# Patient Record
Sex: Male | Born: 1950 | Race: White | Hispanic: No | Marital: Married | Smoking: Never smoker
Health system: Southern US, Community
[De-identification: ages and names within clinical notes are randomized; demographics above are authoritative.]

## PROBLEM LIST (undated history)

## (undated) DIAGNOSIS — Z9889 Other specified postprocedural states: Secondary | ICD-10-CM

## (undated) DIAGNOSIS — R112 Nausea with vomiting, unspecified: Secondary | ICD-10-CM

## (undated) DIAGNOSIS — M1990 Unspecified osteoarthritis, unspecified site: Secondary | ICD-10-CM

## (undated) DIAGNOSIS — C4490 Unspecified malignant neoplasm of skin, unspecified: Secondary | ICD-10-CM

## (undated) HISTORY — PX: SHOULDER SURGERY: SHX246

## (undated) HISTORY — PX: ANKLE GANGLION CYST EXCISION: SHX1148

---
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
## 2014-12-05 ENCOUNTER — Ambulatory Visit: Payer: Self-pay | Admitting: Orthopedic Surgery

## 2014-12-05 NOTE — Progress Notes (Signed)
Preoperative surgical orders have been place into the Epic hospital system for Jonathon Sanders on 12/05/2014, 5:55 PM  by Mickel Crow for surgery on 12-27-2014.  Preop Total Hip - Anterior Approach orders including Experel Injecion, IV Tylenol, and IV Decadron as long as there are no contraindications to the above medications. Arlee Muslim, PA-C

## 2014-12-05 NOTE — H&P (Signed)
Jaben Benegas DOB: 05/13/1951 Married / Language: English / Race: White Male Date of Admission: 12/27/2014 CC: Left Hip Pain History of Present Illness The patient is a 64 year old male who comes in for a preoperative History and Physical. The patient is scheduled for a left total hip arthroplasty (anterior) to be performed by Dr. [name]. [name], MD at [facility] on 12-27-2014. The patient is a 64 year old male who presented with a hip problem. The patient was seen for a second opinion.The patient reports left hip problems including pain symptoms that have been present for 4 month(s) (or more). The symptoms began in association with an established activity. The injury occured this August while the patient was while playing tennis. Prior to being seen today the patient was previously evaluated by a colleague (Dr. [name]) 3 month(s) ago. Symptoms present at the patient's previous evaluation included pain in the hip. Previous workup for this problem has included pelvic x-rays, hip x-rays (which he brought on a disc), spinal MRI and hip MRI (is available on the [facility]). Previous treatment for this problem has included oral corticosteroids, corticosteroid injection (Intra articular done in November) and nonsteroidal anti-inflammatory drugs. All of his pain he developed acutely back around August or September of this past year. 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 Pain is in the groin radiating down the anterior thigh. He saw Dr. [name] initially after feeing this and thought it was a hip strain. He was placed on a Dosepak but it did not provide a tremendous amount of benefit. He had been resting the hip without tennis for a while and it got a little better. It is starting to increase in pain again. He is starting to have more limitations in movement. He had x-rays back in August which just shows some degenerative change. He feels like  the overall process is worsening at this time. He also had a MRI scan with degenerative changes present. He did not have any stress fracture. He is at a stage now where he feels like he needs to do something with this hip because of the marked pain. He had an intraarticular injection which only helped for a few days. He is now ready to proceed with surgery at this time.  They have been treated conservatively in the past for the above stated problem and despite conservative measures, they continue to have progressive pain and severe functional limitations and dysfunction. They have failed non-operative management including home exercise, medications, and injections. It is felt that they would benefit from undergoing total joint replacement. Risks and benefits of the procedure have been discussed with the patient and they elect to proceed with surgery. There are no active contraindications to surgery such as ongoing infection or rapidly progressive neurological disease.  Problem List/Past Medical Primary osteoarthritis of left hip (M16.12) Anxiety Disorder Hypertension Erectile Disorder  Allergies No Known Drug Allergies  Family History Heart Disease Father, Maternal Grandmother, Paternal Grandfather, Paternal Grandmother. Osteoarthritis Mother. Cancer Father.  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 09/30/2014 Number of flights of stairs before winded greater than 5 Tobacco / smoke exposure 09/30/2014: no Exercise Exercises daily; does running / walking, gym / weights and team sport Current drinker 09/30/2014: Currently drinks beer and wine only occasionally per week Current work status  working full time [employer]  Medication History Aspirin Childrens (81MG  Tablet  Chewable, Oral) Active. CeleBREX (200MG  Capsule, Oral) Active. Viagra (100MG  Tablet, Oral) Active. Valtrex (1GM Tablet, Oral) Active. Kenalog in Orabase (0.1% Paste, Mouth/Throat) Active.  Past Surgical History Vasectomy Right Shoulder Surgery Ganglion Cyst Excision Left Thigh   Review of Systems General Not Present- Chills, Fatigue, Fever, Memory Loss, Night Sweats, Weight Gain and Weight Loss. Skin Not Present- Eczema, Hives, Itching, Lesions and Rash. HEENT Not Present- Dentures, Double Vision, Headache, Hearing Loss, Tinnitus and Visual Loss. Respiratory Not Present- Allergies, Chronic Cough, Coughing up blood, Shortness of breath at rest and Shortness of breath with exertion. Cardiovascular Not Present- Chest Pain, Difficulty Breathing Lying Down, Murmur, Palpitations, Racing/skipping heartbeats and Swelling. Gastrointestinal Not Present- Abdominal Pain, Bloody Stool, Constipation, Diarrhea, Difficulty Swallowing, Heartburn, Jaundice, Loss of appetitie, Nausea and Vomiting. Male Genitourinary Not Present- Blood in Urine, Discharge, Flank Pain, Incontinence, Painful Urination, Urgency, Urinary frequency, Urinary Retention, Urinating at Night and Weak urinary stream. Musculoskeletal Present- Joint Pain. Not Present- Back Pain, Joint Swelling, Morning Stiffness, Muscle Pain, Muscle Weakness and Spasms. Neurological Not Present- Blackout spells, Difficulty with balance, Dizziness, Paralysis, Tremor and Weakness. Psychiatric Not Present- Insomnia.   Vitals  Weight: 168 lb Height: 71.5in Body Surface Area: 1.97 m Body Mass Index: 23.1 kg/m  BP: 134/84 (Sitting, Left Arm, Standard)  Physical Exam General Mental Status -Alert, cooperative and good historian. General Appearance-pleasant, Not in acute distress. Orientation-Oriented X3. [place], Well nourished and Well developed.  Head and Neck Head-normocephalic, atraumatic . Neck Global  Assessment - supple, no bruit auscultated on the right, no bruit auscultated on the left.  Eye Pupil - Bilateral-Regular and Round. Motion - Bilateral-EOMI.  Chest and Lung Exam Auscultation Breath sounds - clear at anterior chest wall and clear at posterior chest wall. Adventitious sounds - No Adventitious sounds.  Cardiovascular Auscultation Rhythm - Regular rate and rhythm. Heart Sounds - S1 WNL and S2 WNL. Murmurs & Other Heart Sounds - Auscultation of the heart reveals - No Murmurs.  Abdomen Palpation/Percussion Tenderness - Abdomen is non-tender to palpation. Rigidity (guarding) - Abdomen is soft. Auscultation Auscultation of the abdomen reveals - Bowel sounds normal.  Male Genitourinary Note: Not done, not pertinent to present illness   Musculoskeletal Note: On exam, well developed male alert and oriented in no apparent distress. Evaluation of his right hip shows normal range of motion with no discomfort. His left hip has flexion to about 95, minimal internal rotation and about 20 external rotation, and 20 abduction. He is tender all throughout the hip girdle.  RADIOGRAPHS: Radiographs from August and a MRI subsequent to that. The plain radiographs did show some joint space narrowing and some impingement morphology.  MRI from October did show subchondral cystic changes in the acetabulum as well as significant narrowing.  Repeated plain film showed rapidly progressive arthritis and indeed he has essentially lost his joint space. There is minimal if any joint space left centrally and then superiorly he is bone on bone with osteophyte formation.   Assessment & Plan  Primary osteoarthritis of left hip (M16.12) Note:Surgical Plans: Left Total Hip Repalcement - Anterior Approach  Disposition: Home  PCP: Dr. Maryjean Ka - Patient has been seen preoperatively and felt to be stable for surgery. No further cardaic workup required prior to hip replacement. EKG  performed at PCP showed sinus bradycardia and an incomplete right bundle branch block.  IV TXA  Anesthesia Issues: Postop nausea  one time back in 1976  Signed electronically by Joelene Millin, III PA-C

## 2014-12-05 NOTE — H&P (Signed)
Jonathon Sanders DOB: 1951-07-13 Married / Language: English / Race: White Male Date of Admission:  12/27/2014 CC:  Left Hip Pain History of Present Illness The patient is a 64 year old male who comes in for a preoperative History and Physical. The patient is scheduled for a left total hip arthroplasty (anterior) to be performed by Dr. Dione Sanders. [name], MD at [facility] on 12-27-2014. The patient is a 64 year old male who presented with a hip problem. The patient was seen for a second opinion.The patient reports left hip problems including pain symptoms that have been present for 4 month(s) (or more). The symptoms began in association with an established activity. The injury occured this August while the patient was while playing tennis. Prior to being seen today the patient was previously evaluated by a colleague (Dr. Salvadore Sanders) 3 month(s) ago. Symptoms present at the patient's previous evaluation included pain in the hip. Previous workup for this problem has included pelvic x-rays, hip x-rays (which he brought on a disc), spinal MRI and hip MRI (is available on the [facility]). Previous treatment for this problem has included oral corticosteroids, corticosteroid injection (Intra articular done in November) and nonsteroidal anti-inflammatory drugs. All of his pain he developed acutely back around August or September of this past year. 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 Pain is in the groin radiating down the anterior thigh. He saw Dr. Onnie Sanders initially after feeing this and thought it was a hip strain. He was placed on a Dosepak but it did not provide a tremendous amount of benefit. He had been resting the hip without tennis for a while and it got a little better. It is starting to increase in pain again. He is starting to have more limitations in movement. He had x-rays back in August which just shows some degenerative change. He feels like  the overall process is worsening at this time. He also had a MRI scan with degenerative changes present. He did not have any stress fracture. He is at a stage now where he feels like he needs to do something with this hip because of the marked pain. He had an intraarticular injection which only helped for a few days. He is now ready to proceed with surgery at this time.  They have been treated conservatively in the past for the above stated problem and despite conservative measures, they continue to have progressive pain and severe functional limitations and dysfunction. They have failed non-operative management including home exercise, medications, and injections. It is felt that they would benefit from undergoing total joint replacement. Risks and benefits of the procedure have been discussed with the patient and they elect to proceed with surgery. There are no active contraindications to surgery such as ongoing infection or rapidly progressive neurological disease.  Problem List/Past Medical Primary osteoarthritis of left hip (M16.12) Anxiety Disorder Hypertension Erectile Disorder  Allergies No Known Drug Allergies  Family History Heart Disease Father, Maternal Grandmother, Paternal Grandfather, Paternal Grandmother. Osteoarthritis Mother. Cancer Father.  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 09/30/2014 Number of flights of stairs before winded greater than 5 Tobacco / smoke exposure 09/30/2014: no Exercise Exercises daily; does running / walking, gym / weights and team sport Current drinker 09/30/2014: Currently drinks beer and wine only occasionally per week Current  work status working full time [employer]  Medication History Aspirin Childrens (81MG  Tablet  Chewable, Oral) Active. CeleBREX (200MG  Capsule, Oral) Active. Viagra (100MG  Tablet, Oral) Active. Valtrex (1GM Tablet, Oral) Active. Kenalog in Orabase (0.1% Paste, Mouth/Throat) Active.  Past Surgical History Vasectomy Right Shoulder Surgery Ganglion Cyst Excision Left Thigh   Review of Systems General Not Present- Chills, Fatigue, Fever, Memory Loss, Night Sweats, Weight Gain and Weight Loss. Skin Not Present- Eczema, Hives, Itching, Lesions and Rash. HEENT Not Present- Dentures, Double Vision, Headache, Hearing Loss, Tinnitus and Visual Loss. Respiratory Not Present- Allergies, Chronic Cough, Coughing up blood, Shortness of breath at rest and Shortness of breath with exertion. Cardiovascular Not Present- Chest Pain, Difficulty Breathing Lying Down, Murmur, Palpitations, Racing/skipping heartbeats and Swelling. Gastrointestinal Not Present- Abdominal Pain, Bloody Stool, Constipation, Diarrhea, Difficulty Swallowing, Heartburn, Jaundice, Loss of appetitie, Nausea and Vomiting. Male Genitourinary Not Present- Blood in Urine, Discharge, Flank Pain, Incontinence, Painful Urination, Urgency, Urinary frequency, Urinary Retention, Urinating at Night and Weak urinary stream. Musculoskeletal Present- Joint Pain. Not Present- Back Pain, Joint Swelling, Morning Stiffness, Muscle Pain, Muscle Weakness and Spasms. Neurological Not Present- Blackout spells, Difficulty with balance, Dizziness, Paralysis, Tremor and Weakness. Psychiatric Not Present- Insomnia.   Vitals  Weight: 168 lb Height: 71.5in Body Surface Area: 1.97 m Body Mass Index: 23.1 kg/m  BP: 134/84 (Sitting, Left Arm, Standard)  Physical Exam General Mental Status -Alert, cooperative and good historian. General Appearance-pleasant, Not in acute distress. Orientation-Oriented X3. [name], Well nourished and Well developed.  Head and Neck Head-normocephalic, atraumatic . Neck Global  Assessment - supple, no bruit auscultated on the right, no bruit auscultated on the left.  Eye Pupil - Bilateral-Regular and Round. Motion - Bilateral-EOMI.  Chest and Lung Exam Auscultation Breath sounds - clear at anterior chest wall and clear at posterior chest wall. Adventitious sounds - No Adventitious sounds.  Cardiovascular Auscultation Rhythm - Regular rate and rhythm. Heart Sounds - S1 WNL and S2 WNL. Murmurs & Other Heart Sounds - Auscultation of the heart reveals - No Murmurs.  Abdomen Palpation/Percussion Tenderness - Abdomen is non-tender to palpation. Rigidity (guarding) - Abdomen is soft. Auscultation Auscultation of the abdomen reveals - Bowel sounds normal.  Male Genitourinary Note: Not done, not pertinent to present illness   Musculoskeletal Note: On exam, well developed male alert and oriented in no apparent distress. Evaluation of his right hip shows normal range of motion with no discomfort. His left hip has flexion to about 95, minimal internal rotation and about 20 external rotation, and 20 abduction. He is tender all throughout the hip girdle.  RADIOGRAPHS: Radiographs from August and a MRI subsequent to that. The plain radiographs did show some joint space narrowing and some impingement morphology.  MRI from October did show subchondral cystic changes in the acetabulum as well as significant narrowing.  Repeated plain film showed rapidly progressive arthritis and indeed he has essentially lost his joint space. There is minimal if any joint space left centrally and then superiorly he is bone on bone with osteophyte formation.   Assessment & Plan  Primary osteoarthritis of left hip (M16.12) Note:Surgical Plans: Left Total Hip Repalcement - Anterior Approach  Disposition: Home  PCP: Dr. Maryjean Ka - Patient has been seen preoperatively and felt to be stable for surgery. No further cardaic workup required prior to hip replacement. EKG  performed at PCP showed sinus bradycardia and an incomplete right bundle branch block.  IV TXA  Anesthesia Issues:  Postop nausea one time back in 1976  Signed electronically by Treyven Lafauci Monika Salk, III PA-C

## 2014-12-10 ENCOUNTER — Ambulatory Visit: Payer: Self-pay | Admitting: Orthopedic Surgery

## 2014-12-10 NOTE — H&P (Signed)
Jonathon Sanders DOB: 03/11/1951 Married / Language: English / Race: White Male Date of Admission: 12/27/2014 CC: Left Hip Pain History of Present Illness The patient is a 64 year old male who comes in for a preoperative History and Physical. The patient is scheduled for a left total hip arthroplasty (anterior) to be performed by Dr. [name]. [name], MD at [facility] on 12-27-2014. The patient is a 64 year old male who presented with a hip problem. The patient was seen for a second opinion.The patient reports left hip problems including pain symptoms that have been present for 4 month(s) (or more). The symptoms began in association with an established activity. The injury occured this August while the patient was while playing tennis. Prior to being seen today the patient was previously evaluated by a colleague (Dr. [name]) 3 month(s) ago. Symptoms present at the patient's previous evaluation included pain in the hip. Previous workup for this problem has included pelvic x-rays, hip x-rays (which he brought on a disc), spinal MRI and hip MRI (is available on the [facility]). Previous treatment for this problem has included oral corticosteroids, corticosteroid injection (Intra articular done in November) and nonsteroidal anti-inflammatory drugs. All of his pain he developed acutely back around August or September of this past year. 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 Pain is in the groin radiating down the anterior thigh. He saw Dr. [name] initially after feeing this and thought it was a hip strain. He was placed on a Dosepak but it did not provide a tremendous amount of benefit. He had been resting the hip without tennis for a while and it got a little better. It is starting to increase in pain again. He is starting to have more limitations in movement. He had x-rays back in August which just shows some degenerative change. He feels like  the overall process is worsening at this time. He also had a MRI scan with degenerative changes present. He did not have any stress fracture. He is at a stage now where he feels like he needs to do something with this hip because of the marked pain. He had an intraarticular injection which only helped for a few days. He is now ready to proceed with surgery at this time.  They have been treated conservatively in the past for the above stated problem and despite conservative measures, they continue to have progressive pain and severe functional limitations and dysfunction. They have failed non-operative management including home exercise, medications, and injections. It is felt that they would benefit from undergoing total joint replacement. Risks and benefits of the procedure have been discussed with the patient and they elect to proceed with surgery. There are no active contraindications to surgery such as ongoing infection or rapidly progressive neurological disease.  Problem List/Past Medical Primary osteoarthritis of left hip (M16.12) Anxiety Disorder Hypertension Erectile Disorder  Allergies No Known Drug Allergies  Family History Heart Disease Father, Maternal Grandmother, Paternal Grandfather, Paternal Grandmother. Osteoarthritis Mother. Cancer Father.  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 09/30/2014 Number of flights of stairs before winded greater than 5 Tobacco / smoke exposure 09/30/2014: no Exercise Exercises daily; does running / walking, gym / weights and team sport Current drinker 09/30/2014: Currently drinks beer and wine only occasionally per week Current work status  working full time [employer]  Medication History Aspirin Childrens (81MG  Tablet  Chewable, Oral) Active. CeleBREX (200MG  Capsule, Oral) Active. Viagra (100MG  Tablet, Oral) Active. Valtrex (1GM Tablet, Oral) Active. Kenalog in Orabase (0.1% Paste, Mouth/Throat) Active.  Past Surgical History Vasectomy Right Shoulder Surgery Ganglion Cyst Excision Left Thigh   Review of Systems General Not Present- Chills, Fatigue, Fever, Memory Loss, Night Sweats, Weight Gain and Weight Loss. Skin Not Present- Eczema, Hives, Itching, Lesions and Rash. HEENT Not Present- Dentures, Double Vision, Headache, Hearing Loss, Tinnitus and Visual Loss. Respiratory Not Present- Allergies, Chronic Cough, Coughing up blood, Shortness of breath at rest and Shortness of breath with exertion. Cardiovascular Not Present- Chest Pain, Difficulty Breathing Lying Down, Murmur, Palpitations, Racing/skipping heartbeats and Swelling. Gastrointestinal Not Present- Abdominal Pain, Bloody Stool, Constipation, Diarrhea, Difficulty Swallowing, Heartburn, Jaundice, Loss of appetitie, Nausea and Vomiting. Male Genitourinary Not Present- Blood in Urine, Discharge, Flank Pain, Incontinence, Painful Urination, Urgency, Urinary frequency, Urinary Retention, Urinating at Night and Weak urinary stream. Musculoskeletal Present- Joint Pain. Not Present- Back Pain, Joint Swelling, Morning Stiffness, Muscle Pain, Muscle Weakness and Spasms. Neurological Not Present- Blackout spells, Difficulty with balance, Dizziness, Paralysis, Tremor and Weakness. Psychiatric Not Present- Insomnia.   Vitals  Weight: 168 lb Height: 71.5in Body Surface Area: 1.97 m Body Mass Index: 23.1 kg/m  BP: 134/84 (Sitting, Left Arm, Standard)  Physical Exam General Mental Status -Alert, cooperative and good historian. General Appearance-pleasant, Not in acute distress. Orientation-Oriented X3. [name], Well nourished and Well developed.  Head and Neck Head-normocephalic, atraumatic . Neck Global  Assessment - supple, no bruit auscultated on the right, no bruit auscultated on the left.  Eye Pupil - Bilateral-Regular and Round. Motion - Bilateral-EOMI.  Chest and Lung Exam Auscultation Breath sounds - clear at anterior chest wall and clear at posterior chest wall. Adventitious sounds - No Adventitious sounds.  Cardiovascular Auscultation Rhythm - Regular rate and rhythm. Heart Sounds - S1 WNL and S2 WNL. Murmurs & Other Heart Sounds - Auscultation of the heart reveals - No Murmurs.  Abdomen Palpation/Percussion Tenderness - Abdomen is non-tender to palpation. Rigidity (guarding) - Abdomen is soft. Auscultation Auscultation of the abdomen reveals - Bowel sounds normal.  Male Genitourinary Note: Not done, not pertinent to present illness   Musculoskeletal Note: On exam, well developed male alert and oriented in no apparent distress. Evaluation of his right hip shows normal range of motion with no discomfort. His left hip has flexion to about 95, minimal internal rotation and about 20 external rotation, and 20 abduction. He is tender all throughout the hip girdle.  RADIOGRAPHS: Radiographs from August and a MRI subsequent to that. The plain radiographs did show some joint space narrowing and some impingement morphology.  MRI from October did show subchondral cystic changes in the acetabulum as well as significant narrowing.  Repeated plain film showed rapidly progressive arthritis and indeed he has essentially lost his joint space. There is minimal if any joint space left centrally and then superiorly he is bone on bone with osteophyte formation.   Assessment & Plan  Primary osteoarthritis of left hip (M16.12) Note:Surgical Plans: Left Total Hip Repalcement - Anterior Approach  Disposition: Home  PCP: Dr. Maryjean Ka - Patient has been seen preoperatively and felt to be stable for surgery. No further cardaic workup required prior to hip replacement. EKG  performed at PCP showed sinus bradycardia and an incomplete right bundle branch block.  IV TXA  Anesthesia Issues: Postop nausea  one time back in 1976  Signed electronically by Joelene Millin, III PA-C

## 2014-12-13 NOTE — Pre-Procedure Instructions (Signed)
NEILSON OEHLERT  12/13/2014   Your procedure is scheduled on: Friday, December 27, 2014  Report to [facility] Admitting at 12:30 PM.  Call this number if you have problems the morning of surgery: [phone]   Remember:   Do not eat food or drink liquids after midnight Thursday, December 26, 2014   Take these medicines the morning of surgery with A SIP OF WATER: None Stop taking Aspirin, vitamins, and herbal medications such as  Omega-3 Fatty Acids (FISH OIL ).   Do not take any NSAIDs ie: Ibuprofen, Advil, Naproxen or any medication containing Aspirin such as celecoxib (CELEBREX); stop 1 week prior to procedure ( Friday, December 20, 2014).  Do not wear jewelry, make-up or nail polish.  Do not wear lotions, powders, or perfumes. You may not wear deodorant.  Do not shave 48 hours prior to surgery.   Do not bring valuables to the hospital.  [facility] is not responsible for any belongings or valuables.               Contacts, dentures or bridgework may not be worn into surgery.  Leave suitcase in the car. After surgery it may be brought to your room.  For patients admitted to the hospital, discharge time is determined by your treatment team.               Patients discharged the day of surgery will not be allowed to drive home.  Name and phone number of your driver:   Special Instructions:  Special Instructions:Special Instructions: [facility] - Preparing for Surgery  Before surgery, you can play an important role.  Because skin is not sterile, your skin needs to be as free of germs as possible.  You can reduce the number of germs on you skin by washing with CHG (chlorahexidine gluconate) soap before surgery.  CHG is an antiseptic cleaner which kills germs and bonds with the skin to continue killing germs even after washing.  Please DO NOT use if you have an allergy to CHG or antibacterial soaps.  If your skin becomes reddened/irritated stop using the CHG and inform your  nurse when you arrive at Short Stay.  Do not shave (including legs and underarms) for at least 48 hours prior to the first CHG shower.  You may shave your face.  Please follow these instructions carefully:   1.  Shower with CHG Soap the night before surgery and the morning of Surgery.  2.  If you choose to wash your hair, wash your hair first as usual with your normal shampoo.  3.  After you shampoo, rinse your hair and body thoroughly to remove the Shampoo.  4.  Use CHG as you would any other liquid soap.  You can apply chg directly  to the skin and wash gently with scrungie or a clean washcloth.  5.  Apply the CHG Soap to your body ONLY FROM THE NECK DOWN.  Do not use on open wounds or open sores.  Avoid contact with your eyes, ears, mouth and genitals (private parts).  Wash genitals (private parts) with your normal soap.  6.  Wash thoroughly, paying special attention to the area where your surgery will be performed.  7.  Thoroughly rinse your body with warm water from the neck down.  8.  DO NOT shower/wash with your normal soap after using and rinsing off the CHG Soap.  9.  Pat yourself dry with a clean towel.  10.  Wear clean pajamas.            11.  Place clean sheets on your bed the night of your first shower and do not sleep with pets.  Day of Surgery  Do not apply any lotions/deoderants the morning of surgery.  Please wear clean clothes to the hospital/surgery center.   Please read over the following fact sheets that you were given: Pain Booklet, Coughing and Deep Breathing, Blood Transfusion Information, Total Joint Packet, MRSA Information and Surgical Site Infection Prevention

## 2014-12-16 ENCOUNTER — Encounter (HOSPITAL_COMMUNITY): Payer: Self-pay

## 2014-12-16 ENCOUNTER — Encounter (HOSPITAL_COMMUNITY)
Admission: RE | Admit: 2014-12-16 | Discharge: 2014-12-16 | Disposition: A | Discharge status: Home / Self Care | Payer: Commercial Managed Care - PPO | Source: Ambulatory Visit | Attending: Orthopedic Surgery | Admitting: Orthopedic Surgery

## 2014-12-16 DIAGNOSIS — M1612 Unilateral primary osteoarthritis, left hip: Secondary | ICD-10-CM | POA: Insufficient documentation

## 2014-12-16 DIAGNOSIS — Z01818 Encounter for other preprocedural examination: Secondary | ICD-10-CM | POA: Diagnosis present

## 2014-12-16 HISTORY — DX: Unspecified malignant neoplasm of skin, unspecified: C44.90

## 2014-12-16 HISTORY — DX: Other specified postprocedural states: R11.2

## 2014-12-16 HISTORY — DX: Unspecified osteoarthritis, unspecified site: M19.90

## 2014-12-16 HISTORY — DX: Nausea with vomiting, unspecified: Z98.890

## 2014-12-16 LAB — COMPREHENSIVE METABOLIC PANEL
ALBUMIN: 3.8 g/dL (ref 3.5–5.2)
ALK PHOS: 74 U/L (ref 39–117)
ALT: 34 U/L (ref 0–53)
ANION GAP: 3 — AB (ref 5–15)
AST: 32 U/L (ref 0–37)
BUN: 15 mg/dL (ref 6–23)
CHLORIDE: 105 mmol/L (ref 96–112)
CO2: 31 mmol/L (ref 19–32)
CREATININE: 0.81 mg/dL (ref 0.50–1.35)
Calcium: 9.2 mg/dL (ref 8.4–10.5)
GFR calc non Af Amer: >90 mL/min (ref >90)
Glucose, Bld: 93 mg/dL (ref 70–99)
Potassium: 4 mmol/L (ref 3.5–5.1)
Sodium: 139 mmol/L (ref 135–145)
Total Bilirubin: 0.8 mg/dL (ref 0.3–1.2)
Total Protein: 6.7 g/dL (ref 6.0–8.3)

## 2014-12-16 LAB — URINALYSIS, ROUTINE W REFLEX MICROSCOPIC
BILIRUBIN URINE: NEGATIVE
Glucose, UA: NEGATIVE mg/dL
Hgb urine dipstick: NEGATIVE
KETONES UR: NEGATIVE mg/dL
LEUKOCYTES UA: NEGATIVE
NITRITE: NEGATIVE
Protein, ur: NEGATIVE mg/dL
Specific Gravity, Urine: 1.021 (ref 1.005–1.030)
UROBILINOGEN UA: 1 mg/dL (ref 0.0–1.0)
pH: 6 (ref 5.0–8.0)

## 2014-12-16 LAB — TYPE AND SCREEN
ABO/RH(D): O POS
ANTIBODY SCREEN: NEGATIVE

## 2014-12-16 LAB — SURGICAL PCR SCREEN
MRSA, PCR: NEGATIVE
Staphylococcus aureus: POSITIVE — AB

## 2014-12-16 LAB — ABO/RH: ABO/RH(D): O POS

## 2014-12-16 LAB — CBC
HEMATOCRIT: 42.9 % (ref 39.0–52.0)
HEMOGLOBIN: 14.8 g/dL (ref 13.0–17.0)
MCH: 31.6 pg (ref 26.0–34.0)
MCHC: 34.5 g/dL (ref 30.0–36.0)
MCV: 91.7 fL (ref 78.0–100.0)
Platelets: 209 10*3/uL (ref 150–400)
RBC: 4.68 MIL/uL (ref 4.22–5.81)
RDW: 13.1 % (ref 11.5–15.5)
WBC: 5.5 10*3/uL (ref 4.0–10.5)

## 2014-12-16 LAB — PROTIME-INR
INR: 1 (ref 0.00–1.49)
Prothrombin Time: 13.3 seconds (ref 11.6–15.2)

## 2014-12-16 LAB — APTT: aPTT: 28 seconds (ref 24–37)

## 2014-12-16 NOTE — Progress Notes (Signed)
PCP is Dr Maryjean Ka Denies seeing a cardiologist. Denies ever having a stress test, echo, or card cath. Denies chest pain.

## 2014-12-16 NOTE — Progress Notes (Signed)
Message left for pt at [phone] to inform him of positive MSSA results from PCR screen. Message also left at the [facility] on [address] for script for [place].

## 2014-12-26 MED ORDER — CHLORHEXIDINE GLUCONATE 4 % EX LIQD: 60.0000 mL | Freq: Once | CUTANEOUS | Status: DC

## 2014-12-26 MED ORDER — TRANEXAMIC ACID 100 MG/ML IV SOLN
1000.0000 mg | INTRAVENOUS | Status: DC
  Filled 2014-12-26: qty 10

## 2014-12-26 MED ORDER — DEXAMETHASONE SODIUM PHOSPHATE 10 MG/ML IJ SOLN
10.0000 mg | Freq: Once | INTRAMUSCULAR | Status: AC
  Administered 2014-12-27: 10 mg via INTRAVENOUS
  Filled 2014-12-26: qty 1

## 2014-12-26 MED ORDER — CEFAZOLIN SODIUM-DEXTROSE 2-3 GM-% IV SOLR
2.0000 g | INTRAVENOUS | Status: AC
  Administered 2014-12-27: 2 g via INTRAVENOUS
  Filled 2014-12-26: qty 50

## 2014-12-26 MED ORDER — BUPIVACAINE LIPOSOME 1.3 % IJ SUSP
20.0000 mL | Freq: Once | INTRAMUSCULAR | Status: DC
  Filled 2014-12-26: qty 20

## 2014-12-26 MED ORDER — SODIUM CHLORIDE 0.9 % IV SOLN: INTRAVENOUS | Status: DC

## 2014-12-26 MED ORDER — ACETAMINOPHEN 10 MG/ML IV SOLN
1000.0000 mg | Freq: Once | INTRAVENOUS | Status: AC
  Administered 2014-12-27: 1000 mg via INTRAVENOUS
  Filled 2014-12-26: qty 100

## 2014-12-27 ENCOUNTER — Inpatient Hospital Stay (HOSPITAL_COMMUNITY): Payer: Commercial Managed Care - PPO | Admitting: Anesthesiology

## 2014-12-27 ENCOUNTER — Encounter (HOSPITAL_COMMUNITY): Payer: Self-pay

## 2014-12-27 ENCOUNTER — Inpatient Hospital Stay (HOSPITAL_COMMUNITY): Payer: Commercial Managed Care - PPO

## 2014-12-27 ENCOUNTER — Inpatient Hospital Stay (HOSPITAL_COMMUNITY)
Admission: RE | Admit: 2014-12-27 | Discharge: 2014-12-28 | DRG: 470 | Disposition: A | Discharge status: Home Health | Payer: Commercial Managed Care - PPO | Source: Ambulatory Visit | Attending: Orthopedic Surgery | Admitting: Orthopedic Surgery

## 2014-12-27 ENCOUNTER — Encounter (HOSPITAL_COMMUNITY)
Admission: RE | Disposition: A | Discharge status: Home Health | Payer: Self-pay | Source: Ambulatory Visit | Attending: Orthopedic Surgery

## 2014-12-27 DIAGNOSIS — Z7901 Long term (current) use of anticoagulants: Secondary | ICD-10-CM | POA: Diagnosis not present

## 2014-12-27 DIAGNOSIS — M1612 Unilateral primary osteoarthritis, left hip: Secondary | ICD-10-CM | POA: Diagnosis present

## 2014-12-27 DIAGNOSIS — Z8261 Family history of arthritis: Secondary | ICD-10-CM

## 2014-12-27 DIAGNOSIS — M169 Osteoarthritis of hip, unspecified: Secondary | ICD-10-CM | POA: Diagnosis present

## 2014-12-27 DIAGNOSIS — F419 Anxiety disorder, unspecified: Secondary | ICD-10-CM | POA: Diagnosis present

## 2014-12-27 DIAGNOSIS — Z79899 Other long term (current) drug therapy: Secondary | ICD-10-CM | POA: Diagnosis not present

## 2014-12-27 DIAGNOSIS — I10 Essential (primary) hypertension: Secondary | ICD-10-CM | POA: Diagnosis present

## 2014-12-27 DIAGNOSIS — M25552 Pain in left hip: Secondary | ICD-10-CM | POA: Diagnosis present

## 2014-12-27 DIAGNOSIS — N529 Male erectile dysfunction, unspecified: Secondary | ICD-10-CM | POA: Diagnosis present

## 2014-12-27 DIAGNOSIS — Z8249 Family history of ischemic heart disease and other diseases of the circulatory system: Secondary | ICD-10-CM | POA: Diagnosis not present

## 2014-12-27 DIAGNOSIS — Z419 Encounter for procedure for purposes other than remedying health state, unspecified: Secondary | ICD-10-CM

## 2014-12-27 DIAGNOSIS — Z96649 Presence of unspecified artificial hip joint: Secondary | ICD-10-CM

## 2014-12-27 HISTORY — PX: TOTAL HIP ARTHROPLASTY: SHX124

## 2014-12-27 SURGERY — ARTHROPLASTY, HIP, TOTAL, ANTERIOR APPROACH
Anesthesia: Choice | Site: Hip | Laterality: Left

## 2014-12-27 MED ORDER — RIVAROXABAN 10 MG PO TABS
10.0000 mg | ORAL_TABLET | Freq: Every day | ORAL | Status: DC
  Administered 2014-12-28: 10 mg via ORAL
  Filled 2014-12-27: qty 1

## 2014-12-27 MED ORDER — PROPOFOL 10 MG/ML IV BOLUS
INTRAVENOUS | Status: AC
  Filled 2014-12-27: qty 20

## 2014-12-27 MED ORDER — ACETAMINOPHEN 10 MG/ML IV SOLN
INTRAVENOUS | Status: AC
  Filled 2014-12-27: qty 100

## 2014-12-27 MED ORDER — BISACODYL 10 MG RE SUPP: 10.0000 mg | Freq: Every day | RECTAL | Status: DC | PRN

## 2014-12-27 MED ORDER — 0.9 % SODIUM CHLORIDE (POUR BTL) OPTIME
TOPICAL | Status: DC | PRN
  Administered 2014-12-27: 1000 mL

## 2014-12-27 MED ORDER — BUPIVACAINE LIPOSOME 1.3 % IJ SUSP
INTRAMUSCULAR | Status: DC | PRN
  Administered 2014-12-27: 20 mL

## 2014-12-27 MED ORDER — DEXTROSE 50 % IV SOLN
INTRAVENOUS | Status: AC
  Filled 2014-12-27: qty 50

## 2014-12-27 MED ORDER — KETOROLAC TROMETHAMINE 15 MG/ML IJ SOLN
7.5000 mg | Freq: Four times a day (QID) | INTRAMUSCULAR | Status: AC | PRN
  Administered 2014-12-27: 7.5 mg via INTRAVENOUS
  Filled 2014-12-27 (×2): qty 1

## 2014-12-27 MED ORDER — CEFAZOLIN SODIUM-DEXTROSE 2-3 GM-% IV SOLR
2.0000 g | Freq: Four times a day (QID) | INTRAVENOUS | Status: AC
  Administered 2014-12-28: 2 g via INTRAVENOUS
  Filled 2014-12-27 (×3): qty 50

## 2014-12-27 MED ORDER — METOCLOPRAMIDE HCL 5 MG/ML IJ SOLN: 5.0000 mg | Freq: Three times a day (TID) | INTRAMUSCULAR | Status: DC | PRN

## 2014-12-27 MED ORDER — TRAMADOL HCL 50 MG PO TABS
50.0000 mg | ORAL_TABLET | Freq: Four times a day (QID) | ORAL | Status: DC | PRN
  Administered 2014-12-27 – 2014-12-28 (×3): 100 mg via ORAL
  Filled 2014-12-27 (×3): qty 2

## 2014-12-27 MED ORDER — LACTATED RINGERS IV SOLN
INTRAVENOUS | Status: DC
  Administered 2014-12-27 (×2): via INTRAVENOUS

## 2014-12-27 MED ORDER — PHENOL 1.4 % MT LIQD: 1.0000 | OROMUCOSAL | Status: DC | PRN

## 2014-12-27 MED ORDER — HYDROMORPHONE HCL 1 MG/ML IJ SOLN: 0.2500 mg | INTRAMUSCULAR | Status: DC | PRN

## 2014-12-27 MED ORDER — DEXAMETHASONE SODIUM PHOSPHATE 10 MG/ML IJ SOLN
10.0000 mg | Freq: Once | INTRAMUSCULAR | Status: AC
  Administered 2014-12-28: 10 mg via INTRAVENOUS
  Filled 2014-12-27: qty 1

## 2014-12-27 MED ORDER — POLYETHYLENE GLYCOL 3350 17 G PO PACK: 17.0000 g | PACK | Freq: Every day | ORAL | Status: DC | PRN

## 2014-12-27 MED ORDER — MEPERIDINE HCL 25 MG/ML IJ SOLN: 6.2500 mg | INTRAMUSCULAR | Status: DC | PRN

## 2014-12-27 MED ORDER — METHOCARBAMOL 500 MG PO TABS
500.0000 mg | ORAL_TABLET | Freq: Four times a day (QID) | ORAL | Status: DC | PRN
  Administered 2014-12-27 – 2014-12-28 (×3): 500 mg via ORAL
  Filled 2014-12-27 (×4): qty 1

## 2014-12-27 MED ORDER — GLYCOPYRROLATE 0.2 MG/ML IJ SOLN
INTRAMUSCULAR | Status: AC
  Filled 2014-12-27: qty 3

## 2014-12-27 MED ORDER — MENTHOL 3 MG MT LOZG: 1.0000 | LOZENGE | OROMUCOSAL | Status: DC | PRN

## 2014-12-27 MED ORDER — FLEET ENEMA 7-19 GM/118ML RE ENEM: 1.0000 | ENEMA | Freq: Once | RECTAL | Status: AC | PRN

## 2014-12-27 MED ORDER — SODIUM CHLORIDE 0.9 % IJ SOLN
INTRAMUSCULAR | Status: DC | PRN
  Administered 2014-12-27: 30 mL via INTRAVENOUS

## 2014-12-27 MED ORDER — ONDANSETRON HCL 4 MG PO TABS: 4.0000 mg | ORAL_TABLET | Freq: Four times a day (QID) | ORAL | Status: DC | PRN

## 2014-12-27 MED ORDER — FENTANYL CITRATE 0.05 MG/ML IJ SOLN
INTRAMUSCULAR | Status: DC | PRN
  Administered 2014-12-27: 100 ug via INTRAVENOUS
  Administered 2014-12-27: 50 ug via INTRAVENOUS

## 2014-12-27 MED ORDER — KCL IN DEXTROSE-NACL 20-5-0.9 MEQ/L-%-% IV SOLN
INTRAVENOUS | Status: DC
  Administered 2014-12-28: 02:00:00 via INTRAVENOUS
  Filled 2014-12-27 (×4): qty 1000

## 2014-12-27 MED ORDER — LIDOCAINE HCL (CARDIAC) 20 MG/ML IV SOLN
INTRAVENOUS | Status: DC | PRN
  Administered 2014-12-27: 40 mg via INTRAVENOUS

## 2014-12-27 MED ORDER — BUPIVACAINE HCL (PF) 0.25 % IJ SOLN
INTRAMUSCULAR | Status: DC | PRN
  Administered 2014-12-27: 30 mL

## 2014-12-27 MED ORDER — DIPHENHYDRAMINE HCL 12.5 MG/5ML PO ELIX: 12.5000 mg | ORAL_SOLUTION | ORAL | Status: DC | PRN

## 2014-12-27 MED ORDER — ONDANSETRON HCL 4 MG/2ML IJ SOLN: 4.0000 mg | Freq: Once | INTRAMUSCULAR | Status: DC | PRN

## 2014-12-27 MED ORDER — TRANEXAMIC ACID 100 MG/ML IV SOLN
2000.0000 mg | Freq: Once | INTRAVENOUS | Status: DC
  Filled 2014-12-27: qty 20

## 2014-12-27 MED ORDER — PROPOFOL INFUSION 10 MG/ML OPTIME
INTRAVENOUS | Status: DC | PRN
  Administered 2014-12-27: 50 ug/kg/min via INTRAVENOUS

## 2014-12-27 MED ORDER — OXYCODONE HCL 5 MG PO TABS: 5.0000 mg | ORAL_TABLET | ORAL | Status: DC | PRN

## 2014-12-27 MED ORDER — ACETAMINOPHEN 325 MG PO TABS: 650.0000 mg | ORAL_TABLET | Freq: Four times a day (QID) | ORAL | Status: DC | PRN

## 2014-12-27 MED ORDER — KETOROLAC TROMETHAMINE 15 MG/ML IJ SOLN
INTRAMUSCULAR | Status: AC
  Filled 2014-12-27: qty 1

## 2014-12-27 MED ORDER — ONDANSETRON HCL 4 MG/2ML IJ SOLN: 4.0000 mg | Freq: Four times a day (QID) | INTRAMUSCULAR | Status: DC | PRN

## 2014-12-27 MED ORDER — FENTANYL CITRATE 0.05 MG/ML IJ SOLN
INTRAMUSCULAR | Status: AC
  Filled 2014-12-27: qty 5

## 2014-12-27 MED ORDER — ACETAMINOPHEN 650 MG RE SUPP
650.0000 mg | Freq: Four times a day (QID) | RECTAL | Status: DC | PRN
Start: 2014-12-28 — End: 2014-12-28

## 2014-12-27 MED ORDER — MORPHINE SULFATE 2 MG/ML IJ SOLN
1.0000 mg | INTRAMUSCULAR | Status: DC | PRN
  Administered 2014-12-27: 2 mg via INTRAVENOUS

## 2014-12-27 MED ORDER — TRANEXAMIC ACID 100 MG/ML IV SOLN
2000.0000 mg | INTRAVENOUS | Status: DC | PRN
  Administered 2014-12-27: 2000 mg via INTRAVENOUS

## 2014-12-27 MED ORDER — DOCUSATE SODIUM 100 MG PO CAPS
100.0000 mg | ORAL_CAPSULE | Freq: Two times a day (BID) | ORAL | Status: DC
  Administered 2014-12-27 – 2014-12-28 (×2): 100 mg via ORAL
  Filled 2014-12-27 (×2): qty 1

## 2014-12-27 MED ORDER — MIDAZOLAM HCL 5 MG/5ML IJ SOLN
INTRAMUSCULAR | Status: DC | PRN
  Administered 2014-12-27: 2 mg via INTRAVENOUS

## 2014-12-27 MED ORDER — NEOSTIGMINE METHYLSULFATE 10 MG/10ML IV SOLN
INTRAVENOUS | Status: AC
  Filled 2014-12-27: qty 1

## 2014-12-27 MED ORDER — METOCLOPRAMIDE HCL 10 MG PO TABS: 5.0000 mg | ORAL_TABLET | Freq: Three times a day (TID) | ORAL | Status: DC | PRN

## 2014-12-27 MED ORDER — PROPOFOL 10 MG/ML IV BOLUS
INTRAVENOUS | Status: DC | PRN
  Administered 2014-12-27: 20 mg via INTRAVENOUS

## 2014-12-27 MED ORDER — PHENYLEPHRINE HCL 10 MG/ML IJ SOLN
INTRAMUSCULAR | Status: DC | PRN
  Administered 2014-12-27: 40 ug via INTRAVENOUS
  Administered 2014-12-27: 80 ug via INTRAVENOUS

## 2014-12-27 MED ORDER — METHOCARBAMOL 1000 MG/10ML IJ SOLN
500.0000 mg | Freq: Four times a day (QID) | INTRAVENOUS | Status: DC | PRN
  Filled 2014-12-27: qty 5

## 2014-12-27 MED ORDER — MIDAZOLAM HCL 2 MG/2ML IJ SOLN
INTRAMUSCULAR | Status: AC
  Filled 2014-12-27: qty 2

## 2014-12-27 MED ORDER — ACETAMINOPHEN 500 MG PO TABS
1000.0000 mg | ORAL_TABLET | Freq: Four times a day (QID) | ORAL | Status: DC
  Administered 2014-12-27 – 2014-12-28 (×3): 1000 mg via ORAL
  Filled 2014-12-27 (×3): qty 2

## 2014-12-27 MED ORDER — MORPHINE SULFATE 2 MG/ML IJ SOLN
INTRAMUSCULAR | Status: AC
  Filled 2014-12-27: qty 1

## 2014-12-27 SURGICAL SUPPLY — 42 items
BAG DECANTER FOR FLEXI CONT (MISCELLANEOUS) ×2 IMPLANT
BAG ZIPLOCK 12X15 (MISCELLANEOUS) IMPLANT
BLADE EXTENDED COATED 6.5IN (ELECTRODE) ×2 IMPLANT
BLADE SAG 18X100X1.27 (BLADE) ×2 IMPLANT
CAPT HIP TOTAL 2 ×2 IMPLANT
CLSR STERI-STRIP ANTIMIC 1/2X4 (GAUZE/BANDAGES/DRESSINGS) ×2 IMPLANT
COVER PERINEAL POST (MISCELLANEOUS) ×2 IMPLANT
DECANTER SPIKE VIAL GLASS SM (MISCELLANEOUS) ×2 IMPLANT
DRAPE C-ARM 42X120 X-RAY (DRAPES) ×2 IMPLANT
DRAPE C-ARM 42X72 X-RAY (DRAPES) ×2 IMPLANT
DRAPE STERI IOBAN 125X83 (DRAPES) ×2 IMPLANT
DRAPE U-SHAPE 47X51 STRL (DRAPES) ×6 IMPLANT
DRSG ADAPTIC 3X8 NADH LF (GAUZE/BANDAGES/DRESSINGS) ×2 IMPLANT
DRSG MEPILEX BORDER 4X4 (GAUZE/BANDAGES/DRESSINGS) ×2 IMPLANT
DRSG MEPILEX BORDER 4X8 (GAUZE/BANDAGES/DRESSINGS) ×2 IMPLANT
DURAPREP 26ML APPLICATOR (WOUND CARE) ×2 IMPLANT
ELECT REM PT RETURN 9FT ADLT (ELECTROSURGICAL) ×2
ELECTRODE REM PT RTRN 9FT ADLT (ELECTROSURGICAL) ×1 IMPLANT
EVACUATOR 1/8 PVC DRAIN (DRAIN) ×2 IMPLANT
FACESHIELD WRAPAROUND (MASK) ×8 IMPLANT
GLOVE BIO SURGEON STRL SZ7.5 (GLOVE) ×2 IMPLANT
GLOVE BIO SURGEON STRL SZ8 (GLOVE) ×4 IMPLANT
GLOVE BIOGEL PI IND STRL 8 (GLOVE) ×2 IMPLANT
GLOVE BIOGEL PI INDICATOR 8 (GLOVE) ×2
GOWN STRL REUS W/TWL LRG LVL3 (GOWN DISPOSABLE) ×2 IMPLANT
GOWN STRL REUS W/TWL XL LVL3 (GOWN DISPOSABLE) ×2 IMPLANT
KIT BASIN OR (CUSTOM PROCEDURE TRAY) ×2 IMPLANT
NDL SAFETY ECLIPSE 18X1.5 (NEEDLE) ×2 IMPLANT
NEEDLE HYPO 18GX1.5 SHARP (NEEDLE) ×2
PACK TOTAL JOINT (CUSTOM PROCEDURE TRAY) ×2 IMPLANT
PEN SKIN MARKING BROAD (MISCELLANEOUS) ×2 IMPLANT
STRIP CLOSURE SKIN 1/2X4 (GAUZE/BANDAGES/DRESSINGS) ×2 IMPLANT
SUT ETHIBOND NAB CT1 #1 30IN (SUTURE) ×2 IMPLANT
SUT MNCRL AB 4-0 PS2 18 (SUTURE) ×2 IMPLANT
SUT VIC AB 2-0 CT1 27 (SUTURE) ×2
SUT VIC AB 2-0 CT1 TAPERPNT 27 (SUTURE) ×2 IMPLANT
SUT VLOC 180 0 24IN GS25 (SUTURE) ×2 IMPLANT
SYR 20CC LL (SYRINGE) ×2 IMPLANT
SYR 50ML LL SCALE MARK (SYRINGE) ×2 IMPLANT
TOWEL OR 17X26 10 PK STRL BLUE (TOWEL DISPOSABLE) ×2 IMPLANT
TOWEL OR NON WOVEN STRL DISP B (DISPOSABLE) IMPLANT
TRAY FOLEY CATH 14FRSI W/METER (CATHETERS) ×2 IMPLANT

## 2014-12-27 NOTE — H&P (View-Only) (Signed)
Jonathon Sanders DOB: Feb 13, 1951 Married / Language: English / Race: White Male Date of Admission: 12/27/2014 CC: Left Hip Pain History of Present Illness The patient is a 64 year old male who comes in for a preoperative History and Physical. The patient is scheduled for a left total hip arthroplasty (anterior) to be performed by Dr. [name]. [name], MD at [facility] on 12-27-2014. The patient is a 64 year old male who presented with a hip problem. The patient was seen for a second opinion.The patient reports left hip problems including pain symptoms that have been present for 4 month(s) (or more). The symptoms began in association with an established activity. The injury occured this August while the patient was while playing tennis. Prior to being seen today the patient was previously evaluated by a colleague (Dr. [name]) 3 month(s) ago. Symptoms present at the patient's previous evaluation included pain in the hip. Previous workup for this problem has included pelvic x-rays, hip x-rays (which he brought on a disc), spinal MRI and hip MRI (is available on the [facility]). Previous treatment for this problem has included oral corticosteroids, corticosteroid injection (Intra articular done in November) and nonsteroidal anti-inflammatory drugs. All of his pain he developed acutely back around August or September of this past year. 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 Pain is in the groin radiating down the anterior thigh. He saw Dr. [name] initially after feeing this and thought it was a hip strain. He was placed on a Dosepak but it did not provide a tremendous amount of benefit. He had been resting the hip without tennis for a while and it got a little better. It is starting to increase in pain again. He is starting to have more limitations in movement. He had x-rays back in August which just shows some degenerative change. He feels like  the overall process is worsening at this time. He also had a MRI scan with degenerative changes present. He did not have any stress fracture. He is at a stage now where he feels like he needs to do something with this hip because of the marked pain. He had an intraarticular injection which only helped for a few days. He is now ready to proceed with surgery at this time.  They have been treated conservatively in the past for the above stated problem and despite conservative measures, they continue to have progressive pain and severe functional limitations and dysfunction. They have failed non-operative management including home exercise, medications, and injections. It is felt that they would benefit from undergoing total joint replacement. Risks and benefits of the procedure have been discussed with the patient and they elect to proceed with surgery. There are no active contraindications to surgery such as ongoing infection or rapidly progressive neurological disease.  Problem List/Past Medical Primary osteoarthritis of left hip (M16.12) Anxiety Disorder Hypertension Erectile Disorder  Allergies No Known Drug Allergies  Family History Heart Disease Father, Maternal Grandmother, Paternal Grandfather, Paternal Grandmother. Osteoarthritis Mother. Cancer Father.  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 09/30/2014 Number of flights of stairs before winded greater than 5 Tobacco / smoke exposure 09/30/2014: no Exercise Exercises daily; does running / walking, gym / weights and team sport Current drinker 09/30/2014: Currently drinks beer and wine only occasionally per week Current work status  working full time [place]  Medication History Aspirin Childrens (81MG  Tablet  Chewable, Oral) Active. CeleBREX (200MG  Capsule, Oral) Active. Viagra (100MG  Tablet, Oral) Active. Valtrex (1GM Tablet, Oral) Active. Kenalog in Orabase (0.1% Paste, Mouth/Throat) Active.  Past Surgical History Vasectomy Right Shoulder Surgery Ganglion Cyst Excision Left Thigh   Review of Systems General Not Present- Chills, Fatigue, Fever, Memory Loss, Night Sweats, Weight Gain and Weight Loss. Skin Not Present- Eczema, Hives, Itching, Lesions and Rash. HEENT Not Present- Dentures, Double Vision, Headache, Hearing Loss, Tinnitus and Visual Loss. Respiratory Not Present- Allergies, Chronic Cough, Coughing up blood, Shortness of breath at rest and Shortness of breath with exertion. Cardiovascular Not Present- Chest Pain, Difficulty Breathing Lying Down, Murmur, Palpitations, Racing/skipping heartbeats and Swelling. Gastrointestinal Not Present- Abdominal Pain, Bloody Stool, Constipation, Diarrhea, Difficulty Swallowing, Heartburn, Jaundice, Loss of appetitie, Nausea and Vomiting. Male Genitourinary Not Present- Blood in Urine, Discharge, Flank Pain, Incontinence, Painful Urination, Urgency, Urinary frequency, Urinary Retention, Urinating at Night and Weak urinary stream. Musculoskeletal Present- Joint Pain. Not Present- Back Pain, Joint Swelling, Morning Stiffness, Muscle Pain, Muscle Weakness and Spasms. Neurological Not Present- Blackout spells, Difficulty with balance, Dizziness, Paralysis, Tremor and Weakness. Psychiatric Not Present- Insomnia.   Vitals  Weight: 168 lb Height: 71.5in Body Surface Area: 1.97 m Body Mass Index: 23.1 kg/m  BP: 134/84 (Sitting, Left Arm, Standard)  Physical Exam General Mental Status -Alert, cooperative and good historian. General Appearance-pleasant, Not in acute distress. Orientation-Oriented X3. [place], Well nourished and Well developed.  Head and Neck Head-normocephalic, atraumatic . Neck Global  Assessment - supple, no bruit auscultated on the right, no bruit auscultated on the left.  Eye Pupil - Bilateral-Regular and Round. Motion - Bilateral-EOMI.  Chest and Lung Exam Auscultation Breath sounds - clear at anterior chest wall and clear at posterior chest wall. Adventitious sounds - No Adventitious sounds.  Cardiovascular Auscultation Rhythm - Regular rate and rhythm. Heart Sounds - S1 WNL and S2 WNL. Murmurs & Other Heart Sounds - Auscultation of the heart reveals - No Murmurs.  Abdomen Palpation/Percussion Tenderness - Abdomen is non-tender to palpation. Rigidity (guarding) - Abdomen is soft. Auscultation Auscultation of the abdomen reveals - Bowel sounds normal.  Male Genitourinary Note: Not done, not pertinent to present illness   Musculoskeletal Note: On exam, well developed male alert and oriented in no apparent distress. Evaluation of his right hip shows normal range of motion with no discomfort. His left hip has flexion to about 95, minimal internal rotation and about 20 external rotation, and 20 abduction. He is tender all throughout the hip girdle.  RADIOGRAPHS: Radiographs from August and a MRI subsequent to that. The plain radiographs did show some joint space narrowing and some impingement morphology.  MRI from October did show subchondral cystic changes in the acetabulum as well as significant narrowing.  Repeated plain film showed rapidly progressive arthritis and indeed he has essentially lost his joint space. There is minimal if any joint space left centrally and then superiorly he is bone on bone with osteophyte formation.   Assessment & Plan  Primary osteoarthritis of left hip (M16.12) Note:Surgical Plans: Left Total Hip Repalcement - Anterior Approach  Disposition: Home  PCP: Dr. Maryjean Ka - Patient has been seen preoperatively and felt to be stable for surgery. No further cardaic workup required prior to hip replacement. EKG  performed at PCP showed sinus bradycardia and an incomplete right bundle branch block.  IV TXA  Anesthesia Issues: Postop nausea  one time back in 1976  Signed electronically by Joelene Millin, III PA-C

## 2014-12-27 NOTE — Transfer of Care (Signed)
Immediate Anesthesia Transfer of Care Note  Patient: Jonathon Sanders  Procedure(s) Performed: Procedure(s): LEFT TOTAL HIP ARTHROPLASTY ANTERIOR APPROACH (Left)  Patient Location: PACU  Anesthesia Type:Spinal  Level of Consciousness: awake, alert  and oriented  Airway & Oxygen Therapy: Patient Spontanous Breathing  Post-op Assessment: Report given to RN and Post -op Vital signs reviewed and stable  Post vital signs: Reviewed and stable  Last Vitals:  Filed Vitals:   12/27/14 1146  BP: 160/84  Pulse: 82  Temp: 37 C  Resp: 20    Complications: No apparent anesthesia complications

## 2014-12-27 NOTE — Progress Notes (Signed)
Orthopedic Tech Progress Note Patient Details:  Jonathon Sanders January 25, 1951 [id number]  Ortho Devices Ortho Device/Splint Location: applied ohf to bed Ortho Device/Splint Interventions: Ordered, Application   Braulio Bosch 12/27/2014, 9:25 PM

## 2014-12-27 NOTE — Op Note (Signed)
OPERATIVE REPORT  PREOPERATIVE DIAGNOSIS: Osteoarthritis of the Left hip.   POSTOPERATIVE DIAGNOSIS: Osteoarthritis of the Left  hip.   PROCEDURE: Left total hip arthroplasty, anterior approach.   SURGEON: Gaynelle Arabian, MD   ASSISTANT: Arlee Muslim, PA-C  ANESTHESIA:  Spinal  ESTIMATED BLOOD LOSS:-450 ml    DRAINS: Hemovac x1.   COMPLICATIONS: None   CONDITION: PACU - hemodynamically stable.   BRIEF CLINICAL NOTE: Jonathon Sanders is a 64 y.o. male who has advanced end-  stage arthritis of his Left  hip with progressively worsening pain and  dysfunction.The patient has failed nonoperative management and presents for  total hip arthroplasty.   PROCEDURE IN DETAIL: After successful administration of spinal  anesthetic, the traction boots for the [facility] bed were placed on both  feet and the patient was placed onto the [facility] bed, boots placed into the leg  holders. The Left hip was then isolated from the perineum with plastic  drapes and prepped and draped in the usual sterile fashion. ASIS and  greater trochanter were marked and a oblique incision was made, starting  at about 1 cm lateral and 2 cm distal to the ASIS and coursing towards  the anterior cortex of the femur. The skin was cut with a 10 blade  through subcutaneous tissue to the level of the fascia overlying the  tensor fascia lata muscle. The fascia was then incised in line with the  incision at the junction of the anterior third and posterior 2/3rd. The  muscle was teased off the fascia and then the interval between the TFL  and the rectus was developed. The Hohmann retractor was then placed at  the top of the femoral neck over the capsule. The vessels overlying the  capsule were cauterized and the fat on top of the capsule was removed.  A Hohmann retractor was then placed anterior underneath the rectus  femoris to give exposure to the entire anterior capsule. A T-shaped  capsulotomy was performed. The  edges were tagged and the femoral head  was identified.       Osteophytes are removed off the superior acetabulum.  The femoral neck was then cut in situ with an oscillating saw. Traction  was then applied to the left lower extremity utilizing the [facility]  traction. The femoral head was then removed. Retractors were placed  around the acetabulum and then circumferential removal of the labrum was  performed. Osteophytes were also removed. Reaming starts at 47 mm to  medialize and  Increased in 2 mm increments to 53 mm. We reamed in  approximately 40 degrees of abduction, 20 degrees anteversion. A 54 mm  pinnacle acetabular shell was then impacted in anatomic position under  fluoroscopic guidance with excellent purchase. We did not need to place  any additional dome screws. A 36 mm neutral + 4 marathon liner was then  placed into the acetabular shell.       The femoral lift was then placed along the lateral aspect of the femur  just distal to the vastus ridge. The leg was  externally rotated and capsule  was stripped off the inferior aspect of the femoral neck down to the  level of the lesser trochanter, this was done with electrocautery. The femur was lifted after this was performed. The  leg was then placed and extended in adducted position to essentially delivering the femur. We also removed the capsule superiorly and the  piriformis from the  piriformis fossa to gain excellent exposure of the  proximal femur. Rongeur was used to remove some cancellous bone to get  into the lateral portion of the proximal femur for placement of the  initial starter reamer. The starter broaches was placed  the starter broach  and was shown to go down the center of the canal. Broaching  with the  Corail system was then performed starting at size 8, coursing  Up to size 11. A size 11 had excellent torsional and rotational  and axial stability. The trial standard offset neck was then placed  with a 36 + 8.5 trial  head. The hip was then reduced. We confirmed that  the stem was in the canal both on AP and lateral x-rays. It also has excellent sizing. The hip was reduced with outstanding stability through full extension, full external rotation,  and then flexion in adduction internal rotation. AP pelvis was taken  and the leg lengths were measured and found to be exactly equal. Hip  was then dislocated again and the femoral head and neck removed. The  femoral broach was removed. Size 11 Corail stem with a standard offset  neck was then impacted into the femur following native anteversion. Has  excellent purchase in the canal. Excellent torsional and rotational and  axial stability. It is confirmed to be in the canal on AP and lateral  fluoroscopic views. The 36 + 8.5  ceramic head was placed and the hip  reduced with outstanding stability. Again AP pelvis was taken and it  confirmed that the leg lengths were equal. The wound was then copiously  irrigated with saline solution and the capsule reattached and repaired  with Ethibond suture.  20 mL of Exparel mixed with 50 mL of saline then additional 20 ml of .25% Bupivicaine injected into the capsule and into the edge of the tensor fascia lata as well as subcutaneous tissue. The fascia overlying the tensor fascia lata was  then closed with a running #1 V-Loc. Subcu was closed with interrupted  2-0 Vicryl and subcuticular running 4-0 Monocryl. Incision was cleaned  and dried. Steri-Strips and a bulky sterile dressing applied. Hemovac  drain was hooked to suction and then he was awakened and transported to  recovery in stable condition.        Please note that a surgical assistant was a medical necessity for this procedure to perform it in a safe and expeditious manner. Assistant was necessary to provide appropriate retraction of vital neurovascular structures and to prevent femoral fracture and allow for anatomic placement of the prosthesis.  Gaynelle Arabian,  M.D.

## 2014-12-27 NOTE — Anesthesia Procedure Notes (Signed)
Spinal Patient location during procedure: OR Start time: 12/27/2014 12:50 PM End time: 12/27/2014 12:55 PM Staffing Anesthesiologist: Lillia Abed Performed by: anesthesiologist  Preanesthetic Checklist Completed: patient identified, site marked, surgical consent, pre-op evaluation, timeout performed, IV checked, risks and benefits discussed and monitors and equipment checked Spinal Block Patient position: sitting Prep: ChloraPrep Patient monitoring: heart rate, cardiac monitor, continuous pulse ox and blood pressure Approach: right paramedian Location: L3-4 Injection technique: single-shot Needle Needle type: Pencan  Needle gauge: 24 G Needle length: 9 cm Needle insertion depth: 6 cm Assessment Sensory level: T6

## 2014-12-27 NOTE — Anesthesia Preprocedure Evaluation (Addendum)
Anesthesia Evaluation  Patient identified by MRN, date of birth, ID band Patient awake    Reviewed: Allergy & Precautions, NPO status , Patient's Chart, lab work & pertinent test results  History of Anesthesia Complications (+) PONV  Airway Mallampati: I  TM Distance: >3 FB Neck ROM: Full    Dental  (+) Teeth Intact   Pulmonary          Cardiovascular Rhythm:Regular     Neuro/Psych    GI/Hepatic   Endo/Other    Renal/GU      Musculoskeletal   Abdominal   Peds  Hematology   Anesthesia Other Findings   Reproductive/Obstetrics                            Anesthesia Physical Anesthesia Plan  ASA: II  Anesthesia Plan: Spinal   Post-op Pain Management:    Induction: Intravenous  Airway Management Planned: Simple Face Mask  Additional Equipment:   Intra-op Plan:   Post-operative Plan:   Informed Consent: I have reviewed the patients History and Physical, chart, labs and discussed the procedure including the risks, benefits and alternatives for the proposed anesthesia with the patient or authorized representative who has indicated his/her understanding and acceptance.     Plan Discussed with: CRNA and Surgeon  Anesthesia Plan Comments:        Anesthesia Quick Evaluation

## 2014-12-27 NOTE — Interval H&P Note (Signed)
History and Physical Interval Note:  12/27/2014 11:40 AM  Jonathon Sanders  has presented today for surgery, with the diagnosis of LEFT HIP OSTEOARTHRITIS  The various methods of treatment have been discussed with the patient and family. After consideration of risks, benefits and other options for treatment, the patient has consented to  Procedure(s): LEFT TOTAL HIP ARTHROPLASTY ANTERIOR APPROACH (Left) as a surgical intervention .  The patient's history has been reviewed, patient examined, no change in status, stable for surgery.  I have reviewed the patient's chart and labs.  Questions were answered to the patient's satisfaction.     Gearlean Alf

## 2014-12-27 NOTE — Anesthesia Postprocedure Evaluation (Signed)
Anesthesia Post Note  Patient: Jonathon Sanders  Procedure(s) Performed: Procedure(s) (LRB): LEFT TOTAL HIP ARTHROPLASTY ANTERIOR APPROACH (Left)  Anesthesia type: spinal  Patient location: PACU  Post pain: Pain level controlled  Post assessment: Patient's Cardiovascular Status Stable  Last Vitals:  Filed Vitals:   12/27/14 1700  BP: 129/80  Pulse: 91  Temp: 36.7 C  Resp: 16    Post vital signs: Reviewed and stable  Level of consciousness: awake  Complications: No apparent anesthesia complications

## 2014-12-28 LAB — CBC
HCT: 38.4 % — ABNORMAL LOW (ref 39.0–52.0)
Hemoglobin: 13.2 g/dL (ref 13.0–17.0)
MCH: 32.1 pg (ref 26.0–34.0)
MCHC: 34.4 g/dL (ref 30.0–36.0)
MCV: 93.4 fL (ref 78.0–100.0)
PLATELETS: 196 10*3/uL (ref 150–400)
RBC: 4.11 MIL/uL — ABNORMAL LOW (ref 4.22–5.81)
RDW: 13 % (ref 11.5–15.5)
WBC: 11.2 10*3/uL — AB (ref 4.0–10.5)

## 2014-12-28 LAB — BASIC METABOLIC PANEL
Anion gap: 8 (ref 5–15)
BUN: 13 mg/dL (ref 6–23)
CO2: 28 mmol/L (ref 19–32)
Calcium: 9 mg/dL (ref 8.4–10.5)
Chloride: 98 mmol/L (ref 96–112)
Creatinine, Ser: 0.85 mg/dL (ref 0.50–1.35)
GLUCOSE: 144 mg/dL — AB (ref 70–99)
Potassium: 4.1 mmol/L (ref 3.5–5.1)
Sodium: 134 mmol/L — ABNORMAL LOW (ref 135–145)

## 2014-12-28 MED ORDER — RIVAROXABAN 10 MG PO TABS: 10.0000 mg | ORAL_TABLET | Freq: Every day | ORAL | Status: AC

## 2014-12-28 MED ORDER — TRAMADOL HCL 50 MG PO TABS: 50.0000 mg | ORAL_TABLET | Freq: Four times a day (QID) | ORAL | Status: AC | PRN

## 2014-12-28 MED ORDER — OXYCODONE HCL 5 MG PO TABS
5.0000 mg | ORAL_TABLET | ORAL | Status: AC | PRN
Start: 2014-12-28 — End: ?

## 2014-12-28 MED ORDER — METHOCARBAMOL 500 MG PO TABS: 500.0000 mg | ORAL_TABLET | Freq: Four times a day (QID) | ORAL | Status: AC | PRN

## 2014-12-28 NOTE — Progress Notes (Signed)
   Subjective: 1 Day Post-Op Procedure(s) (LRB): LEFT TOTAL HIP ARTHROPLASTY ANTERIOR APPROACH (Left) Patient reports pain as mild.   Plan is to go Home after hospital stay.  Objective: Vital signs in last 24 hours: Temp:  [97.9 F (36.6 C)-98.6 F (37 C)] 97.9 F (36.6 C) (02/27 0458) Pulse Rate:  [52-91] 58 (02/27 0458) Resp:  [8-20] 16 (02/27 0458) BP: (116-160)/(61-95) 120/63 mmHg (02/27 0458) SpO2:  [96 %-100 %] 99 % (02/27 0458) Weight:  [166 lb (75.297 kg)] 166 lb (75.297 kg) (02/26 1153)  Intake/Output from previous day:  Intake/Output Summary (Last 24 hours) at 12/28/14 0736 Last data filed at 12/28/14 0503  Gross per 24 hour  Intake   1730 ml  Output    760 ml  Net    970 ml    Intake/Output this shift:    Labs: No results for input(s): HGB in the last 72 hours. No results for input(s): WBC, RBC, HCT, PLT in the last 72 hours. No results for input(s): NA, K, CL, CO2, BUN, CREATININE, GLUCOSE, CALCIUM in the last 72 hours. No results for input(s): LABPT, INR in the last 72 hours.  EXAM General - Patient is Alert, Appropriate and Oriented Extremity - Neurologically intact Neurovascular intact No cellulitis present Compartment soft Dressing - dressing C/D/I Motor Function - intact, moving foot and toes well on exam.  Hemovac pulled without difficulty.  Past Medical History  Diagnosis Date  . PONV (postoperative nausea and vomiting)   . Arthritis   . Skin cancer     removed  from his neck    Assessment/Plan: 1 Day Post-Op Procedure(s) (LRB): LEFT TOTAL HIP ARTHROPLASTY ANTERIOR APPROACH (Left) Principal Problem:   OA (osteoarthritis) of hip   Advance diet Up with therapy D/C IV fluids Discharge home with home health today as long as he does well with therapy  DVT Prophylaxis - Xarelto Weight Bearing As Tolerated left Leg Hemovac Pulled   Jonathon Sanders V

## 2014-12-28 NOTE — Evaluation (Signed)
Physical Therapy Evaluation Patient Details Name: Jonathon Sanders MRN: [id number] DOB: [birth year] Today's Date: 12/28/2014   History of Present Illness  Admitted for L anterior THA.    Clinical Impression  Pt is s/p THA resulting in the deficits listed below (see PT Problem List).  Pt will benefit from skilled PT to increase their independence and safety with mobility to allow discharge home today. Pt is moving very well and he hopes to DC home this pm after stair training with his wife. Pt will need RW for DC home.       Follow Up Recommendations Home health PT;Supervision for mobility/OOB    Equipment Recommendations  Rolling walker with 5" wheels    Recommendations for Other Services       Precautions / Restrictions Precautions Precautions: Anterior Hip Restrictions Weight Bearing Restrictions: Yes LLE Weight Bearing: Weight bearing as tolerated      Mobility  Bed Mobility Overal bed mobility:  (NT-up in chair)                Transfers Overall transfer level: Needs assistance Equipment used: None Transfers: Sit to/from Stand Sit to Stand: Min guard         General transfer comment: Cues for technique  Ambulation/Gait Ambulation/Gait assistance: Min guard Ambulation Distance (Feet): 100 Feet Assistive device: Rolling walker (2 wheeled) Gait Pattern/deviations: Step-to pattern;Decreased stride length Gait velocity: decreased      Stairs            Wheelchair Mobility    Modified Rankin (Stroke Patients Only)       Balance                                             Pertinent Vitals/Pain Pain Assessment: 0-10 Pain Score: 2  Pain Location: left hip Pain Descriptors / Indicators: Sore;Numbness Pain Intervention(s): Monitored during session;Premedicated before session;Ice applied    Home Living Family/patient expects to be discharged to:: Private residence Living Arrangements: Spouse/significant other Available  Help at Discharge: Family;Available 24 hours/day Type of Home: House Home Access: Stairs to enter Entrance Stairs-Rails: None Entrance Stairs-Number of Steps: 4 Home Layout: Two level;Able to live on main level with bedroom/bathroom Home Equipment: Kasandra Knudsen - single point      Prior Function Level of Independence: Independent         Comments: Went to gym daily     Hand Dominance        Extremity/Trunk Assessment   Upper Extremity Assessment: Overall WFL for tasks assessed           Lower Extremity Assessment: LLE deficits/detail   LLE Deficits / Details: generalized weakness due to recent surgery  Cervical / Trunk Assessment: Normal  Communication   Communication: No difficulties  Cognition Arousal/Alertness: Awake/alert Behavior During Therapy: WFL for tasks assessed/performed Overall Cognitive Status: Within Functional Limits for tasks assessed                      General Comments General comments (skin integrity, edema, etc.): Wife preent at end of session. Pt feels he will be ready for DC home this pm.      Exercises Total Joint Exercises Ankle Circles/Pumps: AROM;Both;10 reps;Supine Heel Slides: AAROM;Left;5 reps;Supine Hip ABduction/ADduction: AAROM;Left;10 reps;Supine Long Arc Quad: AROM;Left;5 reps;Seated      Assessment/Plan    PT Assessment Patient needs continued PT services  PT Diagnosis Difficulty walking   PT Problem List Decreased strength;Decreased mobility;Decreased knowledge of use of DME;Decreased knowledge of precautions;Pain  PT Treatment Interventions DME instruction;Gait training;Stair training;Therapeutic exercise;Patient/family education   PT Goals (Current goals can be found in the Care Plan section) Acute Rehab PT Goals Patient Stated Goal: to go home today PT Goal Formulation: With patient Time For Goal Achievement: 12/30/14 Potential to Achieve Goals: Good    Frequency 7X/week   Barriers to discharge         Co-evaluation               End of Session Equipment Utilized During Treatment: Gait belt Activity Tolerance: Patient tolerated treatment well Patient left: in chair;with call bell/phone within reach;with family/visitor present Nurse Communication: Mobility status;Other (comment) (Need RW for DC)         Time: 7829-5621 PT Time Calculation (min) (ACUTE ONLY): 28 min   Charges:   PT Evaluation $Initial PT Evaluation Tier I: 1 Procedure PT Treatments $Gait Training: 8-22 mins   PT G Codes:        Melvern Banker 12/28/2014, 8:46 AM  Lavonia Dana, PT  [phone] 12/28/2014

## 2014-12-28 NOTE — Discharge Instructions (Signed)
Dr. Gaynelle Arabian Total Joint Specialist [facility] [address] [place],  [address] [phone]    ANTERIOR APPROACH TOTAL HIP REPLACEMENT POSTOPERATIVE DIRECTIONS   Hip Rehabilitation, Guidelines Following Surgery  The results of a hip operation are greatly improved after range of motion and muscle strengthening exercises. Follow all safety measures which are given to protect your hip. If any of these exercises cause increased pain or swelling in your joint, decrease the amount until you are comfortable again. Then slowly increase the exercises. Call your caregiver if you have problems or questions.  HOME CARE INSTRUCTIONS  Most of the following instructions are designed to prevent the dislocation of your new hip.  Remove items at home which could result in a fall. This includes throw rugs or furniture in walking pathways.  Continue medications as instructed at time of discharge.  You may have some home medications which will be placed on hold until you complete the course of blood thinner medication.  You may start showering once you are discharged home but do not submerge the incision under water. Just pat the incision dry and apply a dry gauze dressing on daily. Do not put on socks or shoes without following the instructions of your caregivers.  Sit on high chairs which makes it easier to stand.  Sit on chairs with arms. Use the chair arms to help push yourself up when arising.  Keep your leg on the side of the operation out in front of you when standing up.  Arrange for the use of a toilet seat elevator so you are not sitting low.    Walk with walker as instructed.  You may resume a sexual relationship in one month or when given the OK by your caregiver.  Use walker as long as suggested by your caregivers.  You may put full weight on your legs and walk as much as is comfortable. Avoid periods of inactivity such as sitting longer than an hour  when not asleep. This helps prevent blood clots.  You may return to work once you are cleared by Engineer, production.  Do not drive a car for 6 weeks or until released by your surgeon.  Do not drive while taking narcotics.  Wear elastic stockings for three weeks following surgery during the day but you may remove then at night.  Make sure you keep all of your appointments after your operation with all of your doctors and caregivers. You should call the office at the above phone number and make an appointment for approximately two weeks after the date of your surgery. Change the dressing daily and reapply a dry dressing each time. Please pick up a stool softener and laxative for home use as long as you are requiring pain medications.  ICE to the affected hip every three hours for 30 minutes at a time and then as needed for pain and swelling.  Continue to use ice on the hip for pain and swelling from surgery. You may notice swelling that will progress down to the foot and ankle.  This is normal after surgery.  Elevate the leg when you are not up walking on it.   It is important for you to complete the blood thinner medication as prescribed by your doctor.  Continue to use the breathing machine which will help keep your temperature down.  It is common for your temperature to cycle up and down following surgery, especially at night when you are not up moving around  and exerting yourself.  The breathing machine keeps your lungs expanded and your temperature down.  RANGE OF MOTION AND STRENGTHENING EXERCISES  These exercises are designed to help you keep full movement of your hip joint. Follow your caregiver's or physical therapist's instructions. Perform all exercises about fifteen times, three times per day or as directed. Exercise both hips, even if you have had only one joint replacement. These exercises can be done on a training (exercise) mat, on the floor, on a table or on a bed. Use whatever works the best  and is most comfortable for you. Use music or television while you are exercising so that the exercises are a pleasant break in your day. This will make your life better with the exercises acting as a break in routine you can look forward to.  Lying on your back, slowly slide your foot toward your buttocks, raising your knee up off the floor. Then slowly slide your foot back down until your leg is straight again.  Lying on your back spread your legs as far apart as you can without causing discomfort.  Lying on your side, raise your upper leg and foot straight up from the floor as far as is comfortable. Slowly lower the leg and repeat.  Lying on your back, tighten up the muscle in the front of your thigh (quadriceps muscles). You can do this by keeping your leg straight and trying to raise your heel off the floor. This helps strengthen the largest muscle supporting your knee.  Lying on your back, tighten up the muscles of your buttocks both with the legs straight and with the knee bent at a comfortable angle while keeping your heel on the floor.   SKILLED REHAB INSTRUCTIONS: If the patient is transferred to a skilled rehab facility following release from the hospital, a list of the current medications will be sent to the facility for the patient to continue.  When discharged from the skilled rehab facility, please have the facility set up the patient's [place] prior to being released. Also, the skilled facility will be responsible for providing the patient with their medications at time of release from the facility to include their pain medication, the muscle relaxants, and their blood thinner medication. If the patient is still at the rehab facility at time of the two week follow up appointment, the skilled rehab facility will also need to assist the patient in arranging follow up appointment in our office and any transportation needs.  MAKE SURE YOU:  Understand these instructions.    Will watch your condition.  Will get help right away if you are not doing well or get worse.  Pick up stool softner and laxative for home use following surgery while on pain medications. Do not submerge incision under water. Please use good hand washing techniques while changing dressing each day. May shower starting three days after surgery. Please use a clean towel to pat the incision dry following showers. Continue to use ice for pain and swelling after surgery. Do not use any lotions or creams on the incision until instructed by your surgeon. Total Hip Protocol.

## 2014-12-28 NOTE — Evaluation (Signed)
Occupational Therapy Evaluation Patient Details Name: Jonathon Sanders MRN: [id number] DOB: [birth year] Today's Date: 12/28/2014    History of Present Illness Admitted for L anterior THA.     Clinical Impression   Pt s/p above. Education provided in session to pt/spouse. Feel pt is safe to d/c home from OT standpoint.     Follow Up Recommendations  No OT follow up;Supervision - Intermittent    Equipment Recommendations  None recommended by OT    Recommendations for Other Services       Precautions / Restrictions Precautions Precautions:  (direct anterior hip-no hip precautions) Precaution Booklet Issued: No Restrictions Weight Bearing Restrictions: Yes LLE Weight Bearing: Weight bearing as tolerated      Mobility Bed Mobility Overal bed mobility:  (pt up in chair)             General bed mobility comments: not assessed  Transfers Overall transfer level: Needs assistance Transfers: Sit to/from Stand Sit to Stand: Supervision         General transfer comment: cues for hand placement with simulated regular height toilet transfer    Balance  No LOB in session- supervision for ambulation with RW.                                          ADL Overall ADL's : Needs assistance/impaired                     Lower Body Dressing: Supervision/safety;Sit to/from stand;Set up   Toilet Transfer: Supervision/safety;Ambulation;RW;Regular Toilet       Tub/ Banker: Min guard;Walk-in shower;Ambulation;Rolling walker   Functional mobility during ADLs: Supervision/safety;Rolling walker General ADL Comments: Educated on LB dressing technique. Educated on safety such as safe shoewear, use of bag on walker, sitting for most of LB ADLs. Recommended spouse be with him for shower transfer. Educated on shower transfer techniques and pt practiced simulated shower transfer.  Explained use of reacher for picking up items.     Vision  Pt  wears glasses.   Perception     Praxis      Pertinent Vitals/Pain Pain Assessment: 0-10 Pain Score: 5  Pain Location: Lt hip Pain Descriptors / Indicators: Sore;Numbness Pain Intervention(s): Monitored during session     Hand Dominance     Extremity/Trunk Assessment Upper Extremity Assessment Upper Extremity Assessment: Overall WFL for tasks assessed   Lower Extremity Assessment Lower Extremity Assessment: Defer to PT evaluation LLE Deficits / Details: generalized weakness due to recent surgery   Cervical / Trunk Assessment Cervical / Trunk Assessment: Normal   Communication Communication Communication: No difficulties   Cognition Arousal/Alertness: Awake/alert Behavior During Therapy: WFL for tasks assessed/performed Overall Cognitive Status: Within Functional Limits for tasks assessed                     General Comments          Shoulder Instructions      Home Living Family/patient expects to be discharged to:: Private residence Living Arrangements: Spouse/significant other Available Help at Discharge: Family;Available 24 hours/day Type of Home: House Home Access: Stairs to enter [employer] of Steps: 4 Entrance Stairs-Rails: None Home Layout: Two level;Able to live on main level with bedroom/bathroom Alternate Level Stairs-Number of Steps: flight   Bathroom Shower/Tub: Walk-in shower with Child psychotherapist: Standard     Home Equipment:  Cane - single point          Prior Functioning/Environment Level of Independence: Independent        Comments: Went to gym daily    OT Diagnosis: Acute pain   OT Problem List:     OT Treatment/Interventions:      OT Goals(Current goals can be found in the care plan section)   OT Frequency:     Barriers to D/C:            Co-evaluation              End of Session Equipment Utilized During Treatment: Rolling walker Nurse Communication: Mobility status  Activity  Tolerance: Patient tolerated treatment well Patient left: in chair;with call bell/phone within reach;with family/visitor present   Time: 4235-3614 OT Time Calculation (min): 16 min Charges:  OT General Charges $OT Visit: 1 Procedure OT Evaluation $Initial OT Evaluation Tier I: 1 Procedure G-CodesBenito Mccreedy OTR/L [id number] 12/28/2014, 11:52 AM

## 2014-12-28 NOTE — Progress Notes (Signed)
CARE MANAGEMENT NOTE 12/28/2014  Patient:  Jonathon Sanders, Jonathon Sanders   Account Number:  [id number]  Date Initiated:  12/28/2014  Documentation initiated by:  Latisia Hilaire  Subjective/Objective Assessment:   Admitting Dx: osteoarthritis  Procedure(s): left anterior hip     Action/Plan:   CM Consult with D/c planning.   Anticipated DC Date:  12/28/2014   Anticipated DC Plan:  [place]  In-house referral  NA      DC Planning Services  CM consult      PAC Choice  [place]   Choice offered to / List presented to:  C-1 Patient   DME arranged  Vassie Moselle      DME agency  [place] arranged  [place]   Status of service:  Completed, signed off Medicare Important Message given?   (If response is "NO", the following Medicare IM given date fields will be blank) Date Medicare IM given:   Medicare IM given by:   Date Additional Medicare IM given:   Additional Medicare IM given by:    Discharge Disposition:    Per UR Regulation:    If discussed at Long Length of Stay Meetings, dates discussed:    Comments:  12/28/14  10:20am  CM referral received via CM consult worklist.  Spoke with pt. &  2 additional family members per pt's verbal consent.   Pt. for ? d/c today aftter 2nd PT session.  States he felt like he did well with PT this am and will have assistance from family post discharge as needed.   Confirmed face to face completed by MD, [facility] agency choice,  DME order, HH order, and demographic information accurate.   [place] referral called to [facility] @ [place] [facility] [phone]).   DME referral called to Jeneen Rinks [phone]) @ [facility]   No other CM needs expressed at this time.  AVS updated as needed.  Advised charge nurse Kenney Houseman) that CM referral completed.    Sheritha Louis H. Burt Knack Therapist, sports, BSN, [place] [phone])

## 2014-12-28 NOTE — Progress Notes (Signed)
Physical Therapy Treatment Patient Details Name: KAMERAN MCNEESE MRN: [id number] DOB: 05-19-1951 Today's Date: 01-20-15    History of Present Illness Admitted for L anterior THA.      PT Comments    Pt progressing well and states he feels ready for DC home with wife to assist.    Follow Up Recommendations        Equipment Recommendations       Recommendations for Other Services       Precautions / Restrictions Precautions Precautions: Anterior Hip Precaution Booklet Issued: No Restrictions Weight Bearing Restrictions: Yes LLE Weight Bearing: Weight bearing as tolerated    Mobility  Bed Mobility Overal bed mobility:  (NT, up in recliner)             General bed mobility comments: not assessed  Transfers Overall transfer level: Needs assistance Equipment used: None Transfers: Sit to/from Stand Sit to Stand: Supervision         General transfer comment: Able to perform with correct technique  Ambulation/Gait Ambulation/Gait assistance: Supervision Ambulation Distance (Feet): 100 Feet Assistive device: Rolling walker (2 wheeled) Gait Pattern/deviations: Step-through pattern Gait velocity: decreased       Stairs Stairs: Yes Stairs assistance: Min assist Stair Management: Forwards;Step to pattern (Held to PT's arms) Number of Stairs: 3 (performed twice) General stair comments: educated on proper technique  Wheelchair Mobility    Modified Rankin (Stroke Patients Only)       Balance                                    Cognition Arousal/Alertness: Awake/alert Behavior During Therapy: WFL for tasks assessed/performed Overall Cognitive Status: Within Functional Limits for tasks assessed                      Exercises      General Comments General comments (skin integrity, edema, etc.): Wife present. Stressed to pt to gradually increase activity levels.      Pertinent Vitals/Pain Pain Assessment:  (Did not  rate)     Home Living Family/patient expects to be discharged to:: Private residence Living Arrangements: Spouse/significant other Available Help at Discharge: Family;Available 24 hours/day Type of Home: House Home Access: Stairs to enter Entrance Stairs-Rails: None Home Layout: Two level;Able to live on main level with bedroom/bathroom Home Equipment: Kasandra Knudsen - single point      Prior Function Level of Independence: Independent      Comments: Went to gym daily   PT Goals (current goals can now be found in the care plan section) Progress towards PT goals: Progressing toward goals    Frequency  7X/week    PT Plan Current plan remains appropriate    Co-evaluation             End of Session Equipment Utilized During Treatment: Gait belt Activity Tolerance: Patient tolerated treatment well Patient left: in chair;with call bell/phone within reach;with family/visitor present     Time:  -     Charges:  $Gait Training: 8-22 mins                    G Codes:      Melvern Banker 2015/01/20, 2:09 PM  Lavonia Dana, [place] 20-Jan-2015

## 2014-12-30 ENCOUNTER — Encounter (HOSPITAL_COMMUNITY): Payer: Self-pay | Admitting: Orthopedic Surgery

## 2015-01-10 NOTE — Discharge Summary (Signed)
Physician Discharge Summary   Patient ID: Jonathon Sanders MRN: [id number] DOB/AGE: 1951-06-10 64 y.o.  Admit date: 12/27/2014 Discharge date: 12/28/2014  Primary Diagnosis:  Osteoarthritis of the Left hip.  Admission Diagnoses:  Past Medical History  Diagnosis Date  . PONV (postoperative nausea and vomiting)   . Arthritis   . Skin cancer     removed  from his neck   Discharge Diagnoses:   Principal Problem:   OA (osteoarthritis) of hip  Estimated body mass index is 22.83 kg/(m^2) as calculated from the following:   Height as of this encounter: 5' 11.5" (1.816 m).   Weight as of this encounter: 75.297 kg (166 lb).  Procedure(s) (LRB): LEFT TOTAL HIP ARTHROPLASTY ANTERIOR APPROACH (Left)   Consults: None  HPI: Jonathon Sanders is a 64 y.o. male who has advanced end-  stage arthritis of his Left hip with progressively worsening pain and  dysfunction.The patient has failed nonoperative management and presents for  total hip arthroplasty.   Laboratory Data: Admission on 12/27/2014, Discharged on 12/28/2014  Component Date Value Ref Range Status  . WBC 12/28/2014 11.2* 4.0 - 10.5 K/uL Final  . RBC 12/28/2014 4.11* 4.22 - 5.81 MIL/uL Final  . Hemoglobin 12/28/2014 13.2  13.0 - 17.0 g/dL Final  . HCT 12/28/2014 38.4* 39.0 - 52.0 % Final  . MCV 12/28/2014 93.4  78.0 - 100.0 fL Final  . MCH 12/28/2014 32.1  26.0 - 34.0 pg Final  . MCHC 12/28/2014 34.4  30.0 - 36.0 g/dL Final  . RDW 12/28/2014 13.0  11.5 - 15.5 % Final  . Platelets 12/28/2014 196  150 - 400 K/uL Final  . Sodium 12/28/2014 134* 135 - 145 mmol/L Final  . Potassium 12/28/2014 4.1  3.5 - 5.1 mmol/L Final  . Chloride 12/28/2014 98  96 - 112 mmol/L Final  . CO2 12/28/2014 28  19 - 32 mmol/L Final  . Glucose, Bld 12/28/2014 144* 70 - 99 mg/dL Final  . BUN 12/28/2014 13  6 - 23 mg/dL Final  . Creatinine, Ser 12/28/2014 0.85  0.50 - 1.35 mg/dL Final  . Calcium 12/28/2014 9.0  8.4 - 10.5 mg/dL Final  . GFR  calc non Af Amer 12/28/2014 >90  >90 mL/min Final  . GFR calc Af Amer 12/28/2014 >90  >90 mL/min Final   Comment: (NOTE) The eGFR has been calculated using the CKD EPI equation. This calculation has not been validated in all clinical situations. eGFR's persistently <90 mL/min signify possible Chronic Kidney Disease.   Georgiann Hahn gap 12/28/2014 8  5 - 15 Final  Hospital Outpatient Visit on 12/16/2014  Component Date Value Ref Range Status  . MRSA, PCR 12/16/2014 NEGATIVE  NEGATIVE Final  . Staphylococcus aureus 12/16/2014 POSITIVE* NEGATIVE Final   Comment:        The Xpert SA Assay (FDA approved for NASAL specimens in patients over 24 years of age), is one component of a comprehensive surveillance program.  Test performance has been validated by [facility] for patients greater than or equal to 8 year old. It is not intended to diagnose infection nor to guide or monitor treatment.   Marland Kitchen aPTT 12/16/2014 28  24 - 37 seconds Final  . WBC 12/16/2014 5.5  4.0 - 10.5 K/uL Final  . RBC 12/16/2014 4.68  4.22 - 5.81 MIL/uL Final  . Hemoglobin 12/16/2014 14.8  13.0 - 17.0 g/dL Final  . HCT 12/16/2014 42.9  39.0 - 52.0 % Final  . MCV 12/16/2014 91.7  78.0 - 100.0 fL Final  . MCH 12/16/2014 31.6  26.0 - 34.0 pg Final  . MCHC 12/16/2014 34.5  30.0 - 36.0 g/dL Final  . RDW 12/16/2014 13.1  11.5 - 15.5 % Final  . Platelets 12/16/2014 209  150 - 400 K/uL Final  . Sodium 12/16/2014 139  135 - 145 mmol/L Final  . Potassium 12/16/2014 4.0  3.5 - 5.1 mmol/L Final  . Chloride 12/16/2014 105  96 - 112 mmol/L Final  . CO2 12/16/2014 31  19 - 32 mmol/L Final  . Glucose, Bld 12/16/2014 93  70 - 99 mg/dL Final  . BUN 12/16/2014 15  6 - 23 mg/dL Final  . Creatinine, Ser 12/16/2014 0.81  0.50 - 1.35 mg/dL Final  . Calcium 12/16/2014 9.2  8.4 - 10.5 mg/dL Final  . Total Protein 12/16/2014 6.7  6.0 - 8.3 g/dL Final  . Albumin 12/16/2014 3.8  3.5 - 5.2 g/dL Final  . AST 12/16/2014 32  0 - 37 U/L Final  .  ALT 12/16/2014 34  0 - 53 U/L Final  . Alkaline Phosphatase 12/16/2014 74  39 - 117 U/L Final  . Total Bilirubin 12/16/2014 0.8  0.3 - 1.2 mg/dL Final  . GFR calc non Af Amer 12/16/2014 >90  >90 mL/min Final  . GFR calc Af Amer 12/16/2014 >90  >90 mL/min Final   Comment: (NOTE) The eGFR has been calculated using the CKD EPI equation. This calculation has not been validated in all clinical situations. eGFR's persistently <90 mL/min signify possible Chronic Kidney Disease.   . Anion gap 12/16/2014 3* 5 - 15 Final  . Prothrombin Time 12/16/2014 13.3  11.6 - 15.2 seconds Final  . INR 12/16/2014 1.00  0.00 - 1.49 Final  . ABO/RH(D) 12/16/2014 O POS   Final  . Antibody Screen 12/16/2014 NEG   Final  . Sample Expiration 12/16/2014 12/30/2014   Final  . Color, Urine 12/16/2014 YELLOW  YELLOW Final  . APPearance 12/16/2014 CLEAR  CLEAR Final  . Specific Gravity, Urine 12/16/2014 1.021  1.005 - 1.030 Final  . pH 12/16/2014 6.0  5.0 - 8.0 Final  . Glucose, UA 12/16/2014 NEGATIVE  NEGATIVE mg/dL Final  . Hgb urine dipstick 12/16/2014 NEGATIVE  NEGATIVE Final  . Bilirubin Urine 12/16/2014 NEGATIVE  NEGATIVE Final  . Ketones, ur 12/16/2014 NEGATIVE  NEGATIVE mg/dL Final  . Protein, ur 12/16/2014 NEGATIVE  NEGATIVE mg/dL Final  . Urobilinogen, UA 12/16/2014 1.0  0.0 - 1.0 mg/dL Final  . Nitrite 12/16/2014 NEGATIVE  NEGATIVE Final  . Leukocytes, UA 12/16/2014 NEGATIVE  NEGATIVE Final   MICROSCOPIC NOT DONE ON URINES WITH NEGATIVE PROTEIN, BLOOD, LEUKOCYTES, NITRITE, OR GLUCOSE <1000 mg/dL.  . ABO/RH(D) 12/16/2014 O POS   Final     X-Rays:Dg Pelvis Portable  12/27/2014   CLINICAL DATA:  Left hip osteoarthritis. Status post left hip arthroplasty.  EXAM: PORTABLE PELVIS 1-2 VIEWS  COMPARISON:  None.  FINDINGS: Left hip prosthesis is seen in appropriate position. No evidence of fracture or dislocation. Small amount of postop soft tissue gas seen overlying left hip.  IMPRESSION: Expected postop  appearance of left hip prosthesis. No evidence of fracture or dislocation.   Electronically Signed   By: Earle Gell M.D.   On: 12/27/2014 15:20   Dg Hip Operative Unilat With Pelvis Left  12/27/2014   CLINICAL DATA:  Left anterior approach hip replacement surgery.  EXAM: OPERATIVE left HIP (WITH PELVIS IF PERFORMED) 1 VIEWS  TECHNIQUE: Fluoroscopic spot image(s) were  submitted for interpretation post-operatively.  COMPARISON:  None.  FINDINGS: There is a left total hip arthroplasty device identified. The hardware components appear to be in anatomic alignment. No dislocation or periprosthetic fracture.  IMPRESSION: 1. Status post left hip arthroplasty.   Electronically Signed   By: Kerby Moors M.D.   On: 12/27/2014 14:33    EKG:No orders found for this or any previous visit.   Hospital Course: Patient was admitted to [facility] and taken to the OR and underwent the above state procedure without complications.  Patient tolerated the procedure well and was later transferred to the recovery room and then to the orthopaedic floor for postoperative care.  They were given PO and IV analgesics for pain control following their surgery.  They were given 24 hours of postoperative antibiotics of  Anti-infectives    Start     Dose/Rate Route Frequency Ordered Stop   12/27/14 1900  ceFAZolin (ANCEF) IVPB 2 g/50 mL premix     2 g 100 mL/hr over 30 Minutes Intravenous Every 6 hours 12/27/14 1752 12/28/14 0659   12/27/14 0600  ceFAZolin (ANCEF) IVPB 2 g/50 mL premix     2 g 100 mL/hr over 30 Minutes Intravenous On call to O.R. 12/26/14 1357 12/27/14 1302     and started on DVT prophylaxis in the form of Xarelto.   PT and OT were ordered for total hip protocol.  The patient was allowed to be WBAT with therapy. Discharge planning was consulted to help with postop disposition and equipment needs.  Patient had a good night on the evening of surgery.  They started to get up OOB with therapy on day one.   Hemovac drain was pulled without difficulty.  Patient was seen in rounds on day one and felt that he was ready to go home following therapy.  Diet: Cardiac diet Activity:WBAT Follow-up:in 2 weeks Disposition - Home Discharged Condition: good      Medication List    STOP taking these medications        aspirin EC 81 MG tablet     celecoxib 200 MG capsule  Commonly known as:  CELEBREX     FISH OIL PO      TAKE these medications        methocarbamol 500 MG tablet  Commonly known as:  ROBAXIN  Take 1 tablet (500 mg total) by mouth every 6 (six) hours as needed for muscle spasms.     oxyCODONE 5 MG immediate release tablet  Commonly known as:  Oxy IR/ROXICODONE  Take 1-2 tablets (5-10 mg total) by mouth every 3 (three) hours as needed for breakthrough pain.     rivaroxaban 10 MG Tabs tablet  Commonly known as:  XARELTO  Take 1 tablet (10 mg total) by mouth daily with breakfast.     sildenafil 50 MG tablet  Commonly known as:  VIAGRA  Take 50 mg by mouth daily as needed for erectile dysfunction.     traMADol 50 MG tablet  Commonly known as:  ULTRAM  Take 1-2 tablets (50-100 mg total) by mouth every 6 (six) hours as needed for moderate pain.           Follow-up Information    Follow up with Gearlean Alf, MD. Schedule an appointment as soon as possible for a visit on 01/09/2015.   Specialty:  Orthopedic Surgery   Why:  Call [phone] monday to make the appointment   Contact information:   [place]  [address] [place] [address] [phone]       Follow up with [facility].   Why:  Home Health Physical Therapy has been ordered.    Contact information:   [address] [place] [address] [phone]       Follow up with [facility].   Why:  Rolling walker delivered to hospital room prior to discharge.    Contact information:   [address] [place] [address] [phone]        Signed: Arlee Muslim, PA-C Orthopaedic Surgery 01/10/2015, 3:43 PM

## 2015-02-04 ENCOUNTER — Ambulatory Visit: Payer: Self-pay | Admitting: Orthopedic Surgery

## 2015-04-23 IMAGING — CR DG PORTABLE PELVIS
1 series · 1 of 1 positions shown · non-contrast
Comparison: None.

CLINICAL DATA: Left hip osteoarthritis. Status post left hip
arthroplasty.

EXAM:
PORTABLE PELVIS 1-2 VIEWS

[AP]
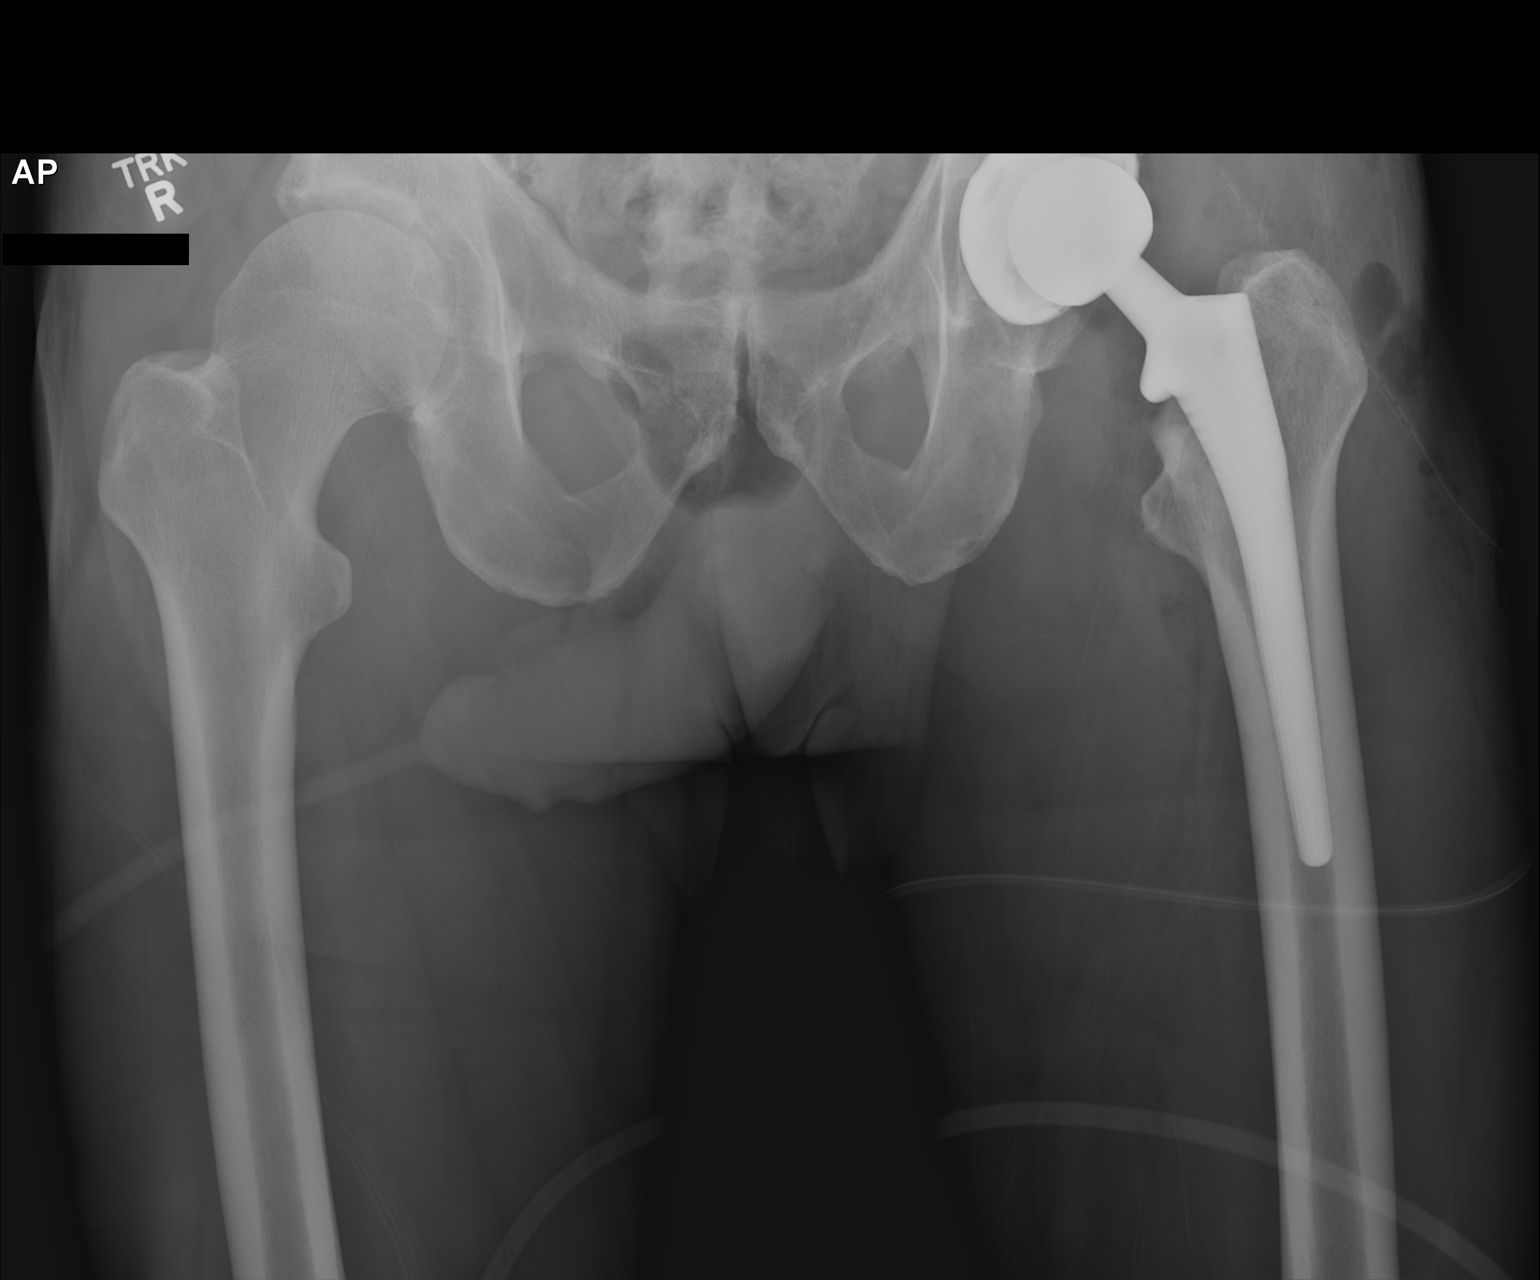

[1 of 1 positions shown; findings below may reference images not displayed]

FINDINGS: Left hip prosthesis is seen in appropriate position. No evidence of
fracture or dislocation. Small amount of postop soft tissue gas seen
overlying left hip.
IMPRESSION: Expected postop appearance of left hip prosthesis. No evidence of
fracture or dislocation.

## 2015-04-23 IMAGING — RF DG HIP (WITH PELVIS) OPERATIVE*L*
1 series · 2 of 2 positions shown · non-contrast
Comparison: None.

CLINICAL DATA: Left anterior approach hip replacement surgery.

EXAM:
OPERATIVE left HIP (WITH PELVIS IF PERFORMED) 1 VIEWS
TECHNIQUE: Fluoroscopic spot image(s) were submitted for interpretation
post-operatively.

[Series 1: run · 2 of 2 slices shown]
[im 1/2]
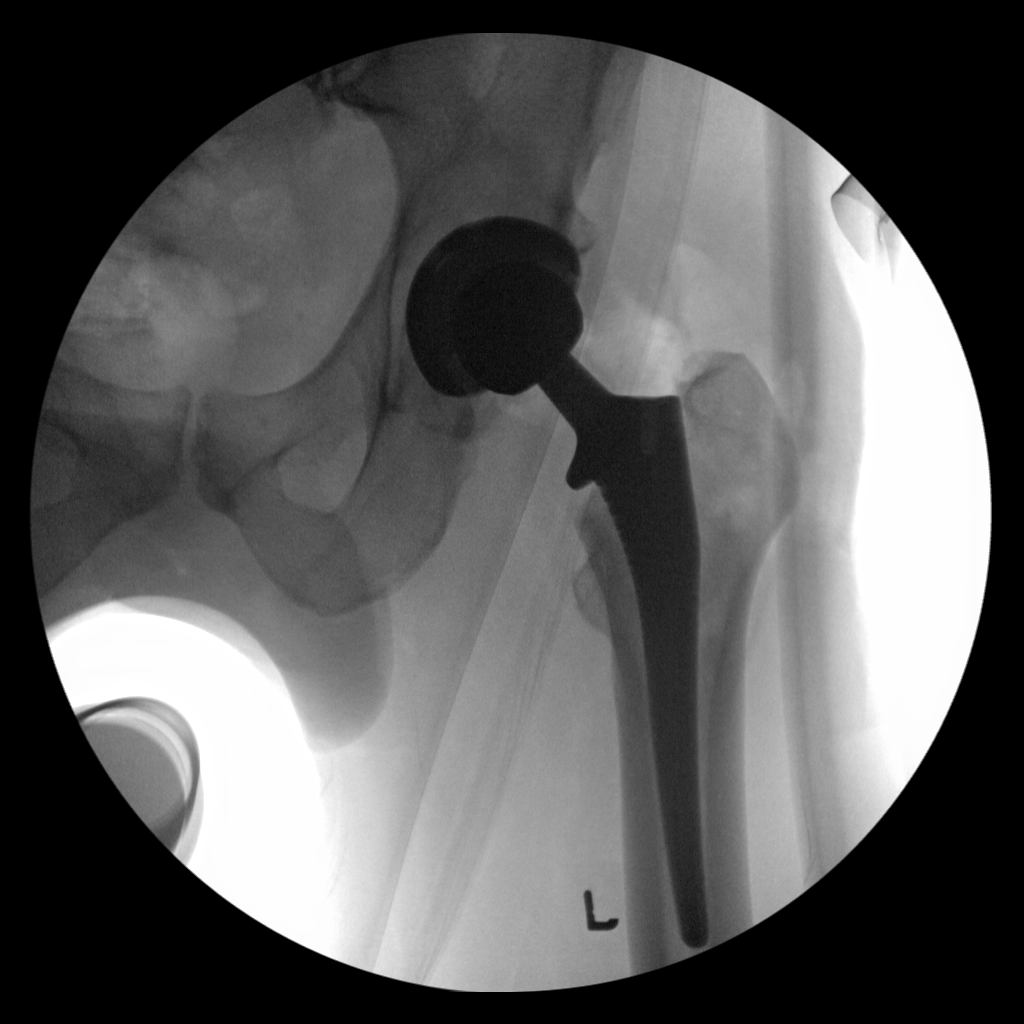
[im 2/2]
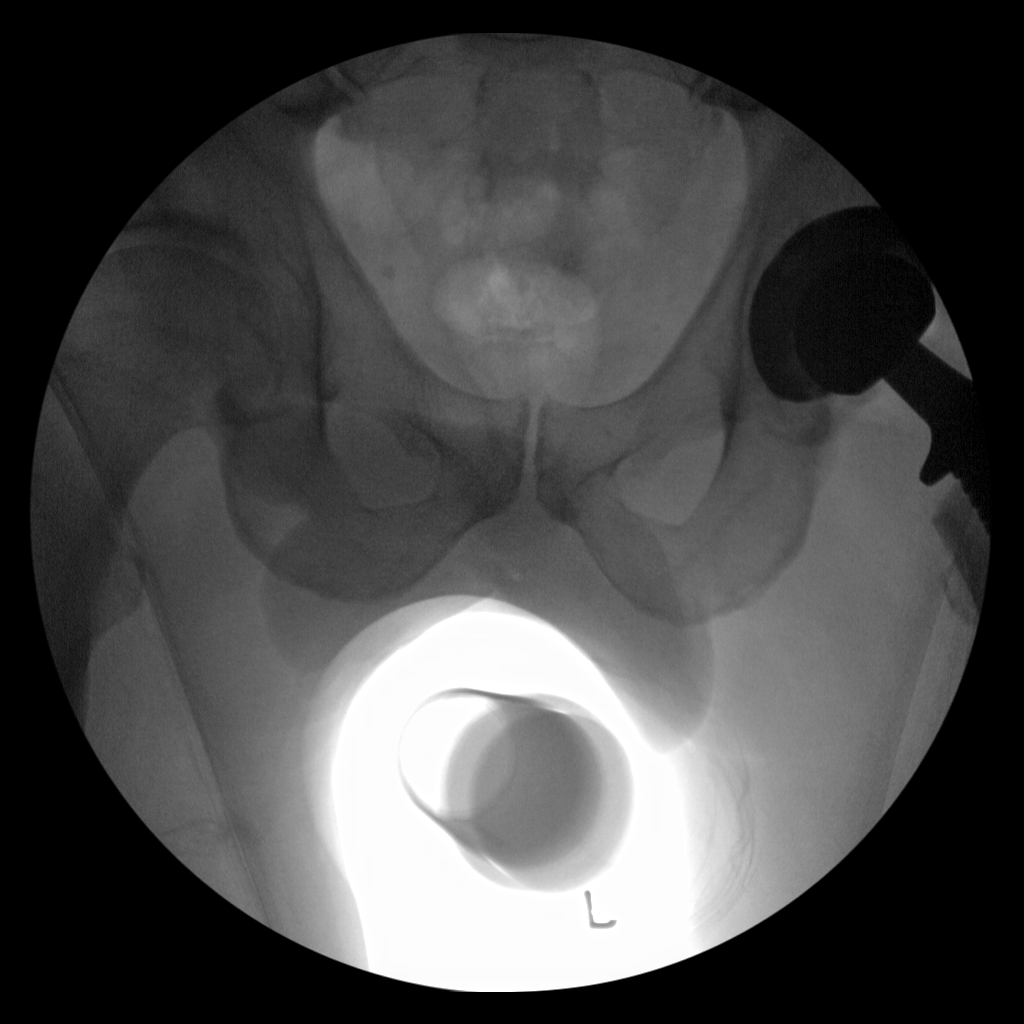

[2 of 2 positions shown; findings below may reference images not displayed]

FINDINGS: There is a left total hip arthroplasty device identified. The
hardware components appear to be in anatomic alignment. No
dislocation or periprosthetic fracture.
IMPRESSION: 1. Status post left hip arthroplasty.

## 2018-06-27 ENCOUNTER — Other Ambulatory Visit: Payer: Self-pay | Admitting: Surgery

## 2018-09-08 ENCOUNTER — Other Ambulatory Visit: Payer: Self-pay | Admitting: Surgery

## 2023-11-26 ENCOUNTER — Emergency Department (HOSPITAL_BASED_OUTPATIENT_CLINIC_OR_DEPARTMENT_OTHER)
Admission: EM | Admit: 2023-11-26 | Discharge: 2023-11-27 | Disposition: A | Discharge status: Home / Self Care | Payer: Commercial Managed Care - PPO | Attending: Emergency Medicine | Admitting: Emergency Medicine

## 2023-11-26 ENCOUNTER — Encounter (HOSPITAL_BASED_OUTPATIENT_CLINIC_OR_DEPARTMENT_OTHER): Payer: Self-pay | Admitting: *Deleted

## 2023-11-26 ENCOUNTER — Other Ambulatory Visit: Payer: Self-pay

## 2023-11-26 DIAGNOSIS — N4833 Priapism, drug-induced: Secondary | ICD-10-CM | POA: Diagnosis present

## 2023-11-26 MED ORDER — PSEUDOEPHEDRINE HCL 30 MG PO TABS
60.0000 mg | ORAL_TABLET | ORAL | Status: AC
  Administered 2023-11-27: 60 mg via ORAL
  Filled 2023-11-26: qty 2

## 2023-11-26 MED ORDER — DIAZEPAM 2 MG PO TABS
2.0000 mg | ORAL_TABLET | Freq: Once | ORAL | Status: AC
  Administered 2023-11-27: 2 mg via ORAL
  Filled 2023-11-26: qty 1

## 2023-11-26 NOTE — ED Triage Notes (Signed)
Pt took Trimix 10/30/1 for ED and has an erection which has not gone down for over 4 hours.  Pt has attempted hot bath, ice pack and walking without relief.

## 2023-11-27 MED ORDER — PHENYLEPHRINE 200 MCG/ML FOR PRIAPISM / HYPOTENSION
50.0000 ug | Freq: Once | INTRAMUSCULAR | Status: DC
Start: 2023-11-27 — End: 2023-11-27
  Filled 2023-11-27: qty 50

## 2023-11-27 MED ORDER — FENTANYL CITRATE PF 50 MCG/ML IJ SOSY
50.0000 ug | PREFILLED_SYRINGE | Freq: Once | INTRAMUSCULAR | Status: DC
  Filled 2023-11-27: qty 1

## 2023-11-27 MED ORDER — FENTANYL CITRATE PF 50 MCG/ML IJ SOSY
50.0000 ug | PREFILLED_SYRINGE | Freq: Once | INTRAMUSCULAR | Status: AC
  Administered 2023-11-27: 50 ug via INTRAVENOUS
  Filled 2023-11-27: qty 1

## 2023-11-27 MED ORDER — KETOROLAC TROMETHAMINE 30 MG/ML IJ SOLN
15.0000 mg | Freq: Once | INTRAMUSCULAR | Status: AC
  Administered 2023-11-27: 15 mg via INTRAVENOUS
  Filled 2023-11-27: qty 1

## 2023-11-27 NOTE — ED Provider Notes (Signed)
[facility] Provider Note   CSN: [id number] Arrival date & time: 11/26/23  2311     History  Chief Complaint  Patient presents with   Priapism    Jonathon Sanders is a 73 y.o. male.  The history is provided by the patient.  Illness Location:  Penis Quality:  Priapism of 4 hours duration Severity:  Moderate Onset quality:  Sudden Duration:  4 hours Timing:  Constant Progression:  Unchanged Chronicity:  New Context:  Injected a medication called Trimix Relieved by:  Nothing Worsened by:  Nothing Ineffective treatments:  Hot bath and ice Associated symptoms: no fever       Past Medical History:  Diagnosis Date   Arthritis    PONV (postoperative nausea and vomiting)    Skin cancer    removed  from his neck     Home Medications Prior to Admission medications   Medication Sig Start Date End Date Taking? Authorizing Provider  methocarbamol (ROBAXIN) 500 MG tablet Take 1 tablet (500 mg total) by mouth every 6 (six) hours as needed for muscle spasms. 12/28/14   Ollen Gross, MD  oxyCODONE (OXY IR/ROXICODONE) 5 MG immediate release tablet Take 1-2 tablets (5-10 mg total) by mouth every 3 (three) hours as needed for breakthrough pain. 12/28/14   Ollen Gross, MD  rivaroxaban (XARELTO) 10 MG TABS tablet Take 1 tablet (10 mg total) by mouth daily with breakfast. 12/28/14   Ollen Gross, MD  sildenafil (VIAGRA) 50 MG tablet Take 50 mg by mouth daily as needed for erectile dysfunction.    [provider]  traMADol (ULTRAM) 50 MG tablet Take 1-2 tablets (50-100 mg total) by mouth every 6 (six) hours as needed for moderate pain. 12/28/14   Ollen Gross, MD      Allergies    Patient has no known allergies.    Review of Systems   Review of Systems  Constitutional:  Negative for fever.  Genitourinary:  Positive for penile pain.  All other systems reviewed and are negative.   Physical Exam Updated Vital Signs BP  (!) 142/90   Pulse 63   Temp (!) 97.5 F (36.4 C)   Resp 16   SpO2 99%  Physical Exam Vitals and nursing note reviewed. Exam conducted with a chaperone present.  Constitutional:      General: He is not in acute distress.    Appearance: He is well-developed. He is not diaphoretic.  HENT:     Head: Normocephalic and atraumatic.     Nose: Nose normal.  Eyes:     Conjunctiva/sclera: Conjunctivae normal.     Pupils: Pupils are equal, round, and reactive to light.  Cardiovascular:     Rate and Rhythm: Normal rate and regular rhythm.     Pulses: Normal pulses.     Heart sounds: Normal heart sounds.  Pulmonary:     Effort: Pulmonary effort is normal.     Breath sounds: Normal breath sounds. No wheezing or rales.  Abdominal:     General: Bowel sounds are normal.     Palpations: Abdomen is soft.     Tenderness: There is no abdominal tenderness. There is no guarding or rebound.  Genitourinary:   Musculoskeletal:        General: Normal range of motion.     Cervical back: Normal range of motion and neck supple.  Skin:    General: Skin is warm and dry.     Capillary Refill: Capillary refill  takes less than 2 seconds.  Neurological:     General: No focal deficit present.     Mental Status: He is alert and oriented to person, place, and time.     ED Results / Procedures / Treatments   Labs (all labs ordered are listed, but only abnormal results are displayed) Labs Reviewed - No data to display  EKG None  Radiology No results found.  Procedures Procedures    Medications Ordered in ED Medications  fentaNYL (SUBLIMAZE) injection 50 mcg (50 mcg Intravenous Not Given 11/27/23 0119)  phenylephrine 200 mcg / ml CONC. DILUTION INJ (ED / Urology USE ONLY) (50 mcg Intracavernosal Not Given 11/27/23 0120)  pseudoephedrine (SUDAFED) tablet 60 mg (60 mg Oral Given 11/27/23 0000)  diazepam (VALIUM) tablet 2 mg (2 mg Oral Given 11/27/23 0000)  fentaNYL (SUBLIMAZE) injection 50 mcg (50  mcg Intravenous Given 11/27/23 0020)  ketorolac (TORADOL) 30 MG/ML injection 15 mg (15 mg Intravenous Given 11/27/23 0115)    ED Course/ Medical Decision Making/ A&P                                 Medical Decision Making Patient with 4 hours of priapism post injection   Amount and/or Complexity of Data Reviewed Independent Historian: spouse    Details: See above  External Data Reviewed: notes.    Details: Previous outpatient notes reviewed   Risk OTC drugs. Prescription drug management. Parenteral controlled substances. Risk Details: Pain medication as well as sudafed given for pain and priapism.  Rechecked and patient was 50-60% improved, planned aspiration.  Nurse informed EDP patient completely flaccid prior to aspiration.  Observed, no recurrence.  Stable for discharge.      Final Clinical Impression(s) / ED Diagnoses Final diagnoses:  Priapism, drug-induced     I have reviewed the triage vital signs and the nursing notes. Pertinent labs & imaging results that were available during my care of the patient were reviewed by me and considered in my medical decision making (see chart for details). After history, exam, and medical workup I feel the patient has been appropriately medically screened and is safe for discharge home. Pertinent diagnoses were discussed with the patient. Patient was given return precautions.    Rx / DC Orders ED Discharge Orders     None         Shaquna Geigle, MD 11/27/23 8413
